# Patient Record
Sex: Female | Born: 1975 | Race: Black or African American | Hispanic: No | Smoking: Never smoker
Health system: Southern US, Community
[De-identification: ages and names within clinical notes are randomized; demographics above are authoritative.]

## PROBLEM LIST (undated history)

## (undated) DIAGNOSIS — D649 Anemia, unspecified: Secondary | ICD-10-CM

## (undated) DIAGNOSIS — E049 Nontoxic goiter, unspecified: Secondary | ICD-10-CM

## (undated) DIAGNOSIS — Z5189 Encounter for other specified aftercare: Secondary | ICD-10-CM

## (undated) DIAGNOSIS — J45909 Unspecified asthma, uncomplicated: Secondary | ICD-10-CM

## (undated) HISTORY — DX: Encounter for other specified aftercare: Z51.89

---

## 2020-08-16 ENCOUNTER — Emergency Department (HOSPITAL_COMMUNITY): Payer: Self-pay

## 2020-08-16 ENCOUNTER — Emergency Department (HOSPITAL_COMMUNITY)
Admission: EM | Admit: 2020-08-16 | Discharge: 2020-08-16 | Disposition: A | Discharge status: Home / Self Care | Payer: Self-pay | Attending: Emergency Medicine | Admitting: Emergency Medicine

## 2020-08-16 ENCOUNTER — Other Ambulatory Visit: Payer: Self-pay

## 2020-08-16 ENCOUNTER — Encounter (HOSPITAL_COMMUNITY): Payer: Self-pay

## 2020-08-16 DIAGNOSIS — J45901 Unspecified asthma with (acute) exacerbation: Secondary | ICD-10-CM | POA: Insufficient documentation

## 2020-08-16 DIAGNOSIS — T59811A Toxic effect of smoke, accidental (unintentional), initial encounter: Secondary | ICD-10-CM

## 2020-08-16 DIAGNOSIS — R519 Headache, unspecified: Secondary | ICD-10-CM | POA: Insufficient documentation

## 2020-08-16 HISTORY — DX: Unspecified asthma, uncomplicated: J45.909

## 2020-08-16 MED ORDER — IPRATROPIUM BROMIDE HFA 17 MCG/ACT IN AERS
2.0000 | INHALATION_SPRAY | Freq: Once | RESPIRATORY_TRACT | Status: AC
  Administered 2020-08-16: 2 via RESPIRATORY_TRACT
  Filled 2020-08-16: qty 12.9

## 2020-08-16 MED ORDER — PREDNISONE 20 MG PO TABS: 40.0000 mg | ORAL_TABLET | Freq: Every day | ORAL | 0 refills | Status: DC

## 2020-08-16 MED ORDER — ALBUTEROL SULFATE HFA 108 (90 BASE) MCG/ACT IN AERS: 8.0000 | INHALATION_SPRAY | RESPIRATORY_TRACT | Status: DC | PRN

## 2020-08-16 MED ORDER — PREDNISONE 20 MG PO TABS
60.0000 mg | ORAL_TABLET | Freq: Once | ORAL | Status: AC
  Administered 2020-08-16: 60 mg via ORAL
  Filled 2020-08-16: qty 3
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
## 2022-02-21 DIAGNOSIS — D259 Leiomyoma of uterus, unspecified: Secondary | ICD-10-CM

## 2022-02-21 HISTORY — DX: Leiomyoma of uterus, unspecified: D25.9

## 2022-02-27 ENCOUNTER — Emergency Department (HOSPITAL_COMMUNITY)
Admission: EM | Admit: 2022-02-27 | Discharge: 2022-02-27 | Disposition: A | Discharge status: Home / Self Care | Payer: Self-pay | Attending: Emergency Medicine | Admitting: Emergency Medicine

## 2022-02-27 ENCOUNTER — Emergency Department (HOSPITAL_COMMUNITY): Payer: Self-pay

## 2022-02-27 ENCOUNTER — Other Ambulatory Visit: Payer: Self-pay

## 2022-02-27 DIAGNOSIS — D259 Leiomyoma of uterus, unspecified: Secondary | ICD-10-CM

## 2022-02-27 DIAGNOSIS — N9489 Other specified conditions associated with female genital organs and menstrual cycle: Secondary | ICD-10-CM | POA: Insufficient documentation

## 2022-02-27 DIAGNOSIS — N939 Abnormal uterine and vaginal bleeding, unspecified: Secondary | ICD-10-CM | POA: Insufficient documentation

## 2022-02-27 DIAGNOSIS — R109 Unspecified abdominal pain: Secondary | ICD-10-CM | POA: Insufficient documentation

## 2022-02-27 DIAGNOSIS — D649 Anemia, unspecified: Secondary | ICD-10-CM | POA: Insufficient documentation

## 2022-02-27 LAB — COMPREHENSIVE METABOLIC PANEL
ALT: 10 U/L (ref 0–44)
AST: 19 U/L (ref 15–41)
Albumin: 3.5 g/dL (ref 3.5–5.0)
Alkaline Phosphatase: 39 U/L (ref 38–126)
Anion gap: 5 (ref 5–15)
BUN: 7 mg/dL (ref 6–20)
CO2: 22 mmol/L (ref 22–32)
Calcium: 8.9 mg/dL (ref 8.9–10.3)
Chloride: 112 mmol/L — ABNORMAL HIGH (ref 98–111)
Creatinine, Ser: 0.69 mg/dL (ref 0.44–1.00)
GFR, Estimated: >60 mL/min (ref >60)
Glucose, Bld: 100 mg/dL — ABNORMAL HIGH (ref 70–99)
Potassium: 3.7 mmol/L (ref 3.5–5.1)
Sodium: 139 mmol/L (ref 135–145)
Total Bilirubin: 0.2 mg/dL — ABNORMAL LOW (ref 0.3–1.2)
Total Protein: 7.1 g/dL (ref 6.5–8.1)

## 2022-02-27 LAB — URINALYSIS, ROUTINE W REFLEX MICROSCOPIC
Bilirubin Urine: NEGATIVE
Glucose, UA: NEGATIVE mg/dL
Hgb urine dipstick: NEGATIVE
Ketones, ur: NEGATIVE mg/dL
Leukocytes,Ua: NEGATIVE
Nitrite: NEGATIVE
Protein, ur: NEGATIVE mg/dL
Specific Gravity, Urine: 1.008 (ref 1.005–1.030)
pH: 6 (ref 5.0–8.0)

## 2022-02-27 LAB — CBC
HCT: 24.8 % — ABNORMAL LOW (ref 36.0–46.0)
Hemoglobin: 6.8 g/dL — CL (ref 12.0–15.0)
MCH: 17.1 pg — ABNORMAL LOW (ref 26.0–34.0)
MCHC: 27.4 g/dL — ABNORMAL LOW (ref 30.0–36.0)
MCV: 62.5 fL — ABNORMAL LOW (ref 80.0–100.0)
Platelets: 318 10*3/uL (ref 150–400)
RBC: 3.97 MIL/uL (ref 3.87–5.11)
RDW: 19.6 % — ABNORMAL HIGH (ref 11.5–15.5)
WBC: 4.1 10*3/uL (ref 4.0–10.5)
nRBC: 0 % (ref 0.0–0.2)

## 2022-02-27 LAB — I-STAT BETA HCG BLOOD, ED (MC, WL, AP ONLY): I-stat hCG, quantitative: <5 m[IU]/mL (ref <5)

## 2022-02-27 LAB — PREPARE RBC (CROSSMATCH)

## 2022-02-27 LAB — ABO/RH: ABO/RH(D): O POS

## 2022-02-27 LAB — LIPASE, BLOOD: Lipase: 35 U/L (ref 11–51)

## 2022-02-27 MED ORDER — SODIUM CHLORIDE 0.9 % IV SOLN: 10.0000 mL/h | Freq: Once | INTRAVENOUS | Status: DC

## 2022-02-27 MED ORDER — HYDROMORPHONE HCL 1 MG/ML IJ SOLN
1.0000 mg | Freq: Once | INTRAMUSCULAR | Status: AC
  Administered 2022-02-27: 1 mg via INTRAVENOUS
  Filled 2022-02-27: qty 1

## 2022-02-27 MED ORDER — MELOXICAM 7.5 MG PO TABS: 7.5000 mg | ORAL_TABLET | Freq: Two times a day (BID) | ORAL | 0 refills | Status: AC | PRN

## 2022-02-27 MED ORDER — IOHEXOL 350 MG/ML SOLN
100.0000 mL | Freq: Once | INTRAVENOUS | Status: AC | PRN
  Administered 2022-02-27: 100 mL via INTRAVENOUS

## 2022-02-27 MED ORDER — MEGESTROL ACETATE 40 MG PO TABS: 120.0000 mg | ORAL_TABLET | Freq: Every day | ORAL | 0 refills | Status: DC

## 2022-02-27 NOTE — ED Notes (Signed)
Pt ambulated to bathroom without assistance 

## 2022-02-27 NOTE — Discharge Instructions (Addendum)
You were given 2 units of blood as a transfusion in the ER for anemia today.  This is likely due to your heavy menstrual bleeding.  Continue taking the iron every day.  I also recommended during your menstrual periods, you take ibuprofen (Advil) 600 mg with breakfast, lunch, and at bedtime.  This can help with both pain and the amount of bleeding that happens.  You should also call to make an appointment with a specialist, OB/GYN provider, who can perform an ultrasound and talk about further medical management for your bleeding.  I have prescribed Megesterol (Megace), to be taken '120mg'$  / day - for the next 30 days.  You will need to call the phone number above for the women's clinic to set up follow up and be seen in the next 2-3 weeks.

## 2022-02-27 NOTE — ED Provider Notes (Signed)
I have examined this patient in person, she does have a mildly tender mass in the lower abdomen that is consistent with having the fibroid uterus on CT scan.  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  Symptoms are minimal, I did discuss the care is with Dr. Elonda Husky -agrees with the management of transfusion and outpatient follow-up and request Megace 120 mg daily for 30 days.  Patient agreeable to the plan, stable for discharge   Noemi Chapel, MD 02/27/22 1911

## 2022-02-27 NOTE — ED Triage Notes (Signed)
Pt. Stated, Im in pain in my lower stomach for a week. And just feel weak and tired.

## 2022-02-27 NOTE — ED Notes (Signed)
Reviewed discharge instruction with pt. Education pt on medication and scheduling follow up appointments. Pt verbalized agreement.

## 2022-02-27 NOTE — ED Notes (Signed)
Hgb 6.8 reported as critical from the lab. Dr. Langston Masker notified. Patient presented to the ED for same at office

## 2022-02-27 NOTE — ED Provider Notes (Signed)
[place] EMERGENCY DEPARTMENT Provider Note   CSN: [id number] Arrival date & time: 02/27/22  4128     History  Chief Complaint  Patient presents with   Abdominal Pain   Weakness    Kirsten Brown is a 46 y.o. female with history of heavy menstrual bleeding presenting to the ED with complaint of abdominal pain.  She reports this began about a week ago, primarily left lower side abdominal pain, bloating, significantly worsening today.  She denies diarrhea or constipation problems.  She denies bloody bowel movements or black or tarry stool.  She is not actively on her menstrual cycle.  She does report that her menses are "extremely heavy".  She reports she takes iron for this, no other medications.  She does not have an OB/GYN.  She does report that she has felt weak, lightheaded, tired this week.  HPI     Home Medications Prior to Admission medications   Medication Sig Start Date End Date Taking? Authorizing Provider  predniSONE (DELTASONE) 20 MG tablet Take 2 tablets (40 mg total) by mouth daily. 08/16/20   Carlisle Cater, PA-C      Allergies    Asa [aspirin]    Review of Systems   Review of Systems  Physical Exam Updated Vital Signs BP 116/69   Pulse 66   Temp 98.1 F (36.7 C)   Resp 17   Ht '5\' 7"'$  (1.702 m)   Wt 68.5 kg   LMP 02/04/2022   SpO2 100%   BMI 23.65 kg/m  Physical Exam Constitutional:      General: She is not in acute distress. HENT:     Head: Normocephalic and atraumatic.  Eyes:     Conjunctiva/sclera: Conjunctivae normal.     Pupils: Pupils are equal, round, and reactive to light.  Cardiovascular:     Rate and Rhythm: Normal rate and regular rhythm.  Pulmonary:     Effort: Pulmonary effort is normal. No respiratory distress.  Abdominal:     General: There is no distension.     Tenderness: There is abdominal tenderness in the suprapubic area and left lower quadrant.  Skin:    General: Skin is warm and dry.   Neurological:     General: No focal deficit present.     Mental Status: She is alert. Mental status is at baseline.  Psychiatric:        Mood and Affect: Mood normal.        Behavior: Behavior normal.    ED Results / Procedures / Treatments   Labs (all labs ordered are listed, but only abnormal results are displayed) Labs Reviewed  COMPREHENSIVE METABOLIC PANEL - Abnormal; Notable for the following components:      Result Value   Chloride 112 (*)    Glucose, Bld 100 (*)    Total Bilirubin 0.2 (*)    All other components within normal limits  CBC - Abnormal; Notable for the following components:   Hemoglobin 6.8 (*)    HCT 24.8 (*)    MCV 62.5 (*)    MCH 17.1 (*)    MCHC 27.4 (*)    RDW 19.6 (*)    All other components within normal limits  URINALYSIS, ROUTINE W REFLEX MICROSCOPIC - Abnormal; Notable for the following components:   Color, Urine STRAW (*)    APPearance HAZY (*)    All other components within normal limits  LIPASE, BLOOD  I-STAT BETA HCG BLOOD, ED ([facility], [facility], [facility] ONLY)  TYPE AND SCREEN  ABO/RH  PREPARE RBC (CROSSMATCH)    EKG None  Radiology No results found.  Procedures .Critical Care Performed by: Wyvonnia Dusky, MD Authorized by: Wyvonnia Dusky, MD   Critical care provider statement:    Critical care time (minutes):  45   Critical care time was exclusive of:  Separately billable procedures and treating other patients   Critical care was necessary to treat or prevent imminent or life-threatening deterioration of the following conditions:  Circulatory failure   Critical care was time spent personally by me on the following activities:  Ordering and performing treatments and interventions, ordering and review of laboratory studies, ordering and review of radiographic studies, pulse oximetry, review of old charts, examination of patient and evaluation of patient's response to treatment   Care discussed with: admitting provider   Comments:      Symptomatic anemia, transfusion    Medications Ordered in ED Medications  0.9 %  sodium chloride infusion (has no administration in time range)  HYDROmorphone (DILAUDID) injection 1 mg (1 mg Intravenous Given 02/27/22 1148)    ED Course/ Medical Decision Making/ A&P Clinical Course as of 02/27/22 1619  Wed Feb 27, 2022  1337 Pain is improved with IV Dilaudid, patient more comfortable now.  She is reportedly next in line for CT scan. [MT]  77 RN has contacted CT multiple times - told patient will be next.  Vitals remain stable at this time. [MT]  1549 Patient reassessed, currently receiving blood transfusion, reports pain is significantly improved and agree clinically this the case.  Plan is to complete 2 units of red blood cell transfusion, and obtain CT scan.  If there are uterine fibroids or nonemergent cause of her abdominal pain, she could be discharged with OB/GYN follow-up.  She is already taking iron, can start taking ibuprofen regularly around her menstrual cycles, which unfortunately are irregular and sometimes bimonthly. [MT]  1550 She is hungry and requesting food. [MT]    Clinical Course User Index [MT] Nicol Herbig, Carola Rhine, MD                           Medical Decision Making Amount and/or Complexity of Data Reviewed Labs: ordered. Radiology: ordered.  Risk Prescription drug management.   This patient presents to the Emergency Department with complaint of abdominal pain. This involves an extensive number of treatment options, and is a complaint that carries with it a high risk of complications and morbidity.  The differential diagnosis includes, but is not limited to, gastritis vs biliary disease vs peptic ulcer vs constipation vs colitis vs UTI vs other  I ordered, reviewed, and interpreted labs, notable for anemia hgb 6.8, microcytic, consistent with iron deficiency/blood loss anemia - no prior records for comparison.  Given that the patient is symptomatic at this time  from an anemia perspective, I have ordered 2 units of blood to be transfused, the patient was consented verbally for blood transfusion.  I ordered medication IV Dilaudid for abdominal pain, 2 units of red blood cells for symptomatic anemia I ordered imaging studies which included CT abdomen pelvis with contrast CT imaging pending at the time of signout to Dr Sabra Heck EDP at 430 pm        Final Clinical Impression(s) / ED Diagnoses Final diagnoses:  Abnormal uterine bleeding  Symptomatic anemia    Rx / DC Orders ED Discharge Orders     None  Wyvonnia Dusky, MD 02/27/22 1620

## 2022-02-27 NOTE — ED Notes (Signed)
RN verified blood with second RN. Blood had O Negative on the bag, on the green sheet pt blood type read as O positive. RN walked Blood and green sheet down to blood bank to verify pt is receiving correct unit of blood. Blood Bank verified that pt can receive O negative blood.

## 2022-02-28 LAB — TYPE AND SCREEN
ABO/RH(D): O POS
Antibody Screen: NEGATIVE
Unit division: 0
Unit division: 0

## 2022-02-28 LAB — BPAM RBC
Blood Product Expiration Date: 202306092359
Blood Product Expiration Date: 202306142359
ISSUE DATE / TIME: 202306071440
ISSUE DATE / TIME: 202306071747
Unit Type and Rh: 5100
Unit Type and Rh: 9500

## 2022-03-21 ENCOUNTER — Encounter: Payer: Self-pay | Admitting: Obstetrics and Gynecology

## 2022-03-21 ENCOUNTER — Other Ambulatory Visit (HOSPITAL_COMMUNITY)
Admission: RE | Admit: 2022-03-21 | Discharge: 2022-03-21 | Disposition: A | Discharge status: Home / Self Care | Payer: Self-pay | Source: Ambulatory Visit | Attending: Obstetrics and Gynecology | Admitting: Obstetrics and Gynecology

## 2022-03-21 ENCOUNTER — Ambulatory Visit (INDEPENDENT_AMBULATORY_CARE_PROVIDER_SITE_OTHER): Payer: Self-pay | Admitting: Obstetrics and Gynecology

## 2022-03-21 VITALS — BP 116/72 | Ht 67.0 in | Wt 152.8 lb

## 2022-03-21 DIAGNOSIS — D219 Benign neoplasm of connective and other soft tissue, unspecified: Secondary | ICD-10-CM

## 2022-03-21 DIAGNOSIS — N939 Abnormal uterine and vaginal bleeding, unspecified: Secondary | ICD-10-CM

## 2022-03-21 DIAGNOSIS — Z7689 Persons encountering health services in other specified circumstances: Secondary | ICD-10-CM

## 2022-03-21 HISTORY — PX: ENDOMETRIAL BIOPSY: SHX622

## 2022-03-21 NOTE — Progress Notes (Signed)
Patient presents today for ED follow-up due to abnormal bleeding and uterine fibroids. She states she has experienced abnormal bleeding since 20218, multiple periods a month and spotting between cycles. She reports moderate cramping as well, right sided more often. Patient states she recently finished Flagyl for BV. No other questions or concerns at this time.

## 2022-03-21 NOTE — Progress Notes (Signed)
HPI:      [name] is a 46 y.o. [phone] who LMP was Patient's last menstrual period was 03/16/2022 (exact date).  Subjective:   She presents today to discuss large uterine fibroids.  She has had significant bleeding and cramping over the last several months which culminated in significant chronic blood loss anemia with a hemoglobin of 6.  In addition she has pain almost every day even when she is not bleeding.  She cannot sit comfortably in a chair.  She is ready to have definitive management for her large fibroids.  She reports that she is absolutely completed childbearing.    Hx: The following portions of the patient's history were reviewed and updated as appropriate:             She  has a past medical history of Asthma and Blood transfusion without reported diagnosis. She does not have a problem list on file. She  has no past surgical history on file. Her family history includes Diabetes in her father; Hypertension in her father and mother. She  reports that she has never smoked. She has never used smokeless tobacco. She reports that she does not currently use alcohol. She reports that she does not use drugs. She has a current medication list which includes the following prescription(s): black currant seed oil, ferrous sulfate, iron-folic acid-vit E70, and turmeric. She is allergic to asa [aspirin].       Review of Systems:  Review of Systems  Constitutional: Denied constitutional symptoms, night sweats, recent illness, fatigue, fever, insomnia and weight loss.  Eyes: Denied eye symptoms, eye pain, photophobia, vision change and visual disturbance.  Ears/Nose/Throat/Neck: Denied ear, nose, throat or neck symptoms, hearing loss, nasal discharge, sinus congestion and sore throat.  Cardiovascular: Denied cardiovascular symptoms, arrhythmia, chest pain/pressure, edema, exercise intolerance, orthopnea and palpitations.  Respiratory: Denied pulmonary symptoms, asthma, pleuritic pain,  productive sputum, cough, dyspnea and wheezing.  Gastrointestinal: Denied, gastro-esophageal reflux, melena, nausea and vomiting.  Genitourinary: See HPI for additional information.  Musculoskeletal: Denied musculoskeletal symptoms, stiffness, swelling, muscle weakness and myalgia.  Dermatologic: Denied dermatology symptoms, rash and scar.  Neurologic: Denied neurology symptoms, dizziness, headache, neck pain and syncope.  Psychiatric: Denied psychiatric symptoms, anxiety and depression.  Endocrine: Denied endocrine symptoms including hot flashes and night sweats.   Meds:   Current Outpatient Medications on File Prior to Visit  Medication Sig Dispense Refill   BLACK CURRANT SEED OIL PO Take by mouth.     ferrous sulfate 325 (65 FE) MG tablet Take 325 mg by mouth daily with breakfast.     IRON-FOLIC ACID-VIT J50 PO Take by mouth.     Turmeric (QC TUMERIC COMPLEX PO) Take by mouth.     No current facility-administered medications on file prior to visit.      Objective:     Vitals:   03/21/22 1434  BP: 116/72   Filed Weights   03/21/22 1434  Weight: 152 lb 12.8 oz (69.3 kg)              Physical examination   Pelvic:   Vulva: Normal appearance.  No lesions.  Vagina: No lesions or abnormalities noted.  Support: Normal pelvic support.  Urethra No masses tenderness or scarring.  Meatus Normal size without lesions or prolapse.  Cervix: Normal appearance.  No lesions.  Anus: Normal exam.  No lesions.  Perineum: Normal exam.  No lesions.        Bimanual   Uterus: 20 weeks  size, irregular, tender.  Mobile.  AV.  Adnexae: No masses.  Non-tender to palpation.  Cul-de-sac: Negative for abnormality.   Endometrial Biopsy After discussion with the patient regarding her abnormal uterine bleeding I recommended that she proceed with an endometrial biopsy for further diagnosis. The risks, benefits, alternatives, and indications for an endometrial biopsy were discussed with the patient  in detail. She understood the risks including infection, bleeding, cervical laceration and uterine perforation.  Verbal consent was obtained.   PROCEDURE NOTE:  Vacurette endometrial biopsy was performed using aseptic technique with iodine preparation.  The uterus was sounded to a length of as long as the pipette.  Adequate sampling was obtained with minimal blood loss.  The patient tolerated the procedure well.  Disposition will be pending pathology           Assessment:    [id number] There are no problems to display for this patient.    1. Establishing care with new doctor, encounter for   2. Fibroid   3. Abnormal uterine bleeding     Await biopsy results if negative for hyperplasia or malignancy consider other managements.   Plan:            1.  Multiple managements for uterine fibroids discussed in detail.  The natural course and history of fibroids and menopause was discussed.  Ways of managing bleeding and cramping with fibroids including IUD, uterine fibroid embolization, endometrial ablation and hysterectomy were discussed.  Patient seems to be leaning toward hysterectomy because she desires immediate definitive management.  Will likely need TAH or possibly robot for hysterectomy. Plan to discuss with Cherry  Orders No orders of the defined types were placed in this encounter.   No orders of the defined types were placed in this encounter.     F/U  Return in about 3 weeks (around 04/11/2022) for We will contact her with any abnormal test results. I spent 34 minutes involved in the care of this patient preparing to see the patient by obtaining and reviewing her medical history (including labs, imaging tests and prior procedures), documenting clinical information in the electronic health record (EHR), counseling and coordinating care plans, writing and sending prescriptions, ordering tests or procedures and in direct communicating with the patient and medical staff discussing  pertinent items from her history and physical exam.  Finis Bud, M.D. 03/21/2022 3:33 PM

## 2022-03-25 LAB — SURGICAL PATHOLOGY

## 2022-04-03 NOTE — Progress Notes (Signed)
Kirsten Brown: Good news, your results show not problems with the lining of the uterus.  Therefor we now know that the fibroids are the issue. Dr. Amalia Hailey

## 2022-04-04 NOTE — Progress Notes (Signed)
Result forwarded via MyChart

## 2022-05-24 ENCOUNTER — Encounter: Payer: Self-pay | Admitting: Obstetrics and Gynecology

## 2022-05-24 ENCOUNTER — Ambulatory Visit (INDEPENDENT_AMBULATORY_CARE_PROVIDER_SITE_OTHER): Payer: Self-pay | Admitting: Obstetrics and Gynecology

## 2022-05-24 VITALS — BP 108/70 | Ht 67.0 in | Wt 157.0 lb

## 2022-05-24 DIAGNOSIS — D219 Benign neoplasm of connective and other soft tissue, unspecified: Secondary | ICD-10-CM

## 2022-05-24 NOTE — Progress Notes (Signed)
Patient presents today to follow-up on fibroids. She states she has made the decision to have a hysterectomy at this time. No additional concerns.

## 2022-05-24 NOTE — Progress Notes (Signed)
HPI:      [name] is a 46 y.o. [phone] who LMP was Patient's last menstrual period was 05/07/2022 (exact date).  Subjective:   She presents today to discuss her endometrial biopsy and large uterine fibroids.  She reports that over the last month she had a period but it was not as severe as it was in the past.  She still remains generally uncomfortable from large uterine fibroids.  She has decided upon definitive management-TAH as previously discussed.    Hx: The following portions of the patient's history were reviewed and updated as appropriate:             She  has a past medical history of Asthma and Blood transfusion without reported diagnosis. She does not have a problem list on file. She  has no past surgical history on file. Her family history includes Diabetes in her father; Hypertension in her father and mother. She  reports that she has never smoked. She has never used smokeless tobacco. She reports that she does not currently use alcohol. She reports that she does not use drugs. She currently has no medications in their medication list. She is allergic to asa [aspirin].       Review of Systems:  Review of Systems  Constitutional: Denied constitutional symptoms, night sweats, recent illness, fatigue, fever, insomnia and weight loss.  Eyes: Denied eye symptoms, eye pain, photophobia, vision change and visual disturbance.  Ears/Nose/Throat/Neck: Denied ear, nose, throat or neck symptoms, hearing loss, nasal discharge, sinus congestion and sore throat.  Cardiovascular: Denied cardiovascular symptoms, arrhythmia, chest pain/pressure, edema, exercise intolerance, orthopnea and palpitations.  Respiratory: Denied pulmonary symptoms, asthma, pleuritic pain, productive sputum, cough, dyspnea and wheezing.  Gastrointestinal: Denied, gastro-esophageal reflux, melena, nausea and vomiting.  Genitourinary: See HPI for additional information.  Musculoskeletal: Denied musculoskeletal  symptoms, stiffness, swelling, muscle weakness and myalgia.  Dermatologic: Denied dermatology symptoms, rash and scar.  Neurologic: Denied neurology symptoms, dizziness, headache, neck pain and syncope.  Psychiatric: Denied psychiatric symptoms, anxiety and depression.  Endocrine: Denied endocrine symptoms including hot flashes and night sweats.   Meds:   No current outpatient medications on file prior to visit.   No current facility-administered medications on file prior to visit.      Objective:     Vitals:   05/24/22 1126  BP: 108/70   Filed Weights   05/24/22 1126  Weight: 157 lb (71.2 kg)                        Assessment:    [id number] There are no problems to display for this patient.    1. Fibroid     Large uterine fibroids greater than 20 weeks causing pelvic pain and severe dysmenorrhea. Patient desires hysterectomy. No hyperplasia or malignancy on endometrial biopsy   Plan:            1.  Patient to schedule for preop appointment.  2.  We have discussed possibility of oophorectomy at the time of TAH.  Patient will consider this option and inform us at her preop appointment.  Endometrial biopsy results reviewed with the patient. Orders No orders of the defined types were placed in this encounter.   No orders of the defined types were placed in this encounter.     F/U  Return in about 3 weeks (around 06/14/2022). I spent 18 minutes involved in the care of this patient preparing to see the patient by obtaining  and reviewing her medical history (including labs, imaging tests and prior procedures), documenting clinical information in the electronic health record (EHR), counseling and coordinating care plans, writing and sending prescriptions, ordering tests or procedures and in direct communicating with the patient and medical staff discussing pertinent items from her history and physical exam.  Finis Bud, M.D. 05/24/2022 12:03 PM

## 2022-05-28 ENCOUNTER — Encounter: Payer: Self-pay | Admitting: Internal Medicine

## 2022-05-28 ENCOUNTER — Ambulatory Visit: Payer: Self-pay | Admitting: Internal Medicine

## 2022-05-28 VITALS — BP 110/70 | HR 80 | Resp 12 | Ht 65.0 in | Wt 154.0 lb

## 2022-05-28 DIAGNOSIS — T7431XA Adult psychological abuse, confirmed, initial encounter: Secondary | ICD-10-CM | POA: Insufficient documentation

## 2022-05-28 DIAGNOSIS — D500 Iron deficiency anemia secondary to blood loss (chronic): Secondary | ICD-10-CM | POA: Insufficient documentation

## 2022-05-28 DIAGNOSIS — E049 Nontoxic goiter, unspecified: Secondary | ICD-10-CM

## 2022-05-28 DIAGNOSIS — D259 Leiomyoma of uterus, unspecified: Secondary | ICD-10-CM

## 2022-05-28 DIAGNOSIS — J4540 Moderate persistent asthma, uncomplicated: Secondary | ICD-10-CM

## 2022-05-28 MED ORDER — BUDESONIDE-FORMOTEROL FUMARATE 80-4.5 MCG/ACT IN AERO: 2.0000 | INHALATION_SPRAY | Freq: Two times a day (BID) | RESPIRATORY_TRACT | 11 refills | Status: DC
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
## 2022-05-28 NOTE — Patient Instructions (Signed)
Call for influenza and COVID vaccines end of September

## 2022-05-30 ENCOUNTER — Other Ambulatory Visit: Payer: Self-pay

## 2022-05-30 DIAGNOSIS — Z13220 Encounter for screening for lipoid disorders: Secondary | ICD-10-CM

## 2022-05-30 DIAGNOSIS — D500 Iron deficiency anemia secondary to blood loss (chronic): Secondary | ICD-10-CM

## 2022-05-30 DIAGNOSIS — Z79899 Other long term (current) drug therapy: Secondary | ICD-10-CM

## 2022-05-31 LAB — COMPREHENSIVE METABOLIC PANEL
ALT: 13 IU/L (ref 0–32)
AST: 20 IU/L (ref 0–40)
Albumin/Globulin Ratio: 1.5 (ref 1.2–2.2)
Albumin: 4.2 g/dL (ref 3.9–4.9)
Alkaline Phosphatase: 46 IU/L (ref 44–121)
BUN/Creatinine Ratio: 13 (ref 9–23)
BUN: 9 mg/dL (ref 6–24)
Bilirubin Total: 0.3 mg/dL (ref 0.0–1.2)
CO2: 22 mmol/L (ref 20–29)
Calcium: 9.5 mg/dL (ref 8.7–10.2)
Chloride: 104 mmol/L (ref 96–106)
Creatinine, Ser: 0.72 mg/dL (ref 0.57–1.00)
Globulin, Total: 2.8 g/dL (ref 1.5–4.5)
Glucose: 91 mg/dL (ref 70–99)
Potassium: 4.3 mmol/L (ref 3.5–5.2)
Sodium: 140 mmol/L (ref 134–144)
Total Protein: 7 g/dL (ref 6.0–8.5)
eGFR: 105 mL/min/{1.73_m2} (ref >59)

## 2022-05-31 LAB — CBC WITH DIFFERENTIAL/PLATELET
Basophils Absolute: 0 10*3/uL (ref 0.0–0.2)
Basos: 1 %
EOS (ABSOLUTE): 0.5 10*3/uL — ABNORMAL HIGH (ref 0.0–0.4)
Eos: 9 %
Hematocrit: 40.8 % (ref 34.0–46.6)
Hemoglobin: 13.6 g/dL (ref 11.1–15.9)
Immature Grans (Abs): 0 10*3/uL (ref 0.0–0.1)
Immature Granulocytes: 0 %
Lymphocytes Absolute: 1.7 10*3/uL (ref 0.7–3.1)
Lymphs: 35 %
MCH: 29.2 pg (ref 26.6–33.0)
MCHC: 33.3 g/dL (ref 31.5–35.7)
MCV: 88 fL (ref 79–97)
Monocytes Absolute: 0.4 10*3/uL (ref 0.1–0.9)
Monocytes: 8 %
Neutrophils Absolute: 2.2 10*3/uL (ref 1.4–7.0)
Neutrophils: 47 %
Platelets: 197 10*3/uL (ref 150–450)
RBC: 4.66 x10E6/uL (ref 3.77–5.28)
RDW: 14.8 % (ref 11.7–15.4)
WBC: 4.8 10*3/uL (ref 3.4–10.8)

## 2022-05-31 LAB — LIPID PANEL W/O CHOL/HDL RATIO
Cholesterol, Total: 171 mg/dL (ref 100–199)
HDL: 50 mg/dL (ref >39)
LDL Chol Calc (NIH): 110 mg/dL — ABNORMAL HIGH (ref 0–99)
Triglycerides: 58 mg/dL (ref 0–149)
VLDL Cholesterol Cal: 11 mg/dL (ref 5–40)

## 2022-06-13 ENCOUNTER — Encounter: Payer: Self-pay | Admitting: Obstetrics and Gynecology

## 2022-06-13 DIAGNOSIS — Z01818 Encounter for other preprocedural examination: Secondary | ICD-10-CM

## 2022-06-14 ENCOUNTER — Encounter: Payer: Self-pay | Admitting: Obstetrics and Gynecology

## 2022-06-14 ENCOUNTER — Ambulatory Visit (INDEPENDENT_AMBULATORY_CARE_PROVIDER_SITE_OTHER): Payer: Self-pay | Admitting: Obstetrics and Gynecology

## 2022-06-14 VITALS — BP 124/86 | Ht 65.0 in | Wt 153.0 lb

## 2022-06-14 DIAGNOSIS — N939 Abnormal uterine and vaginal bleeding, unspecified: Secondary | ICD-10-CM

## 2022-06-14 DIAGNOSIS — D219 Benign neoplasm of connective and other soft tissue, unspecified: Secondary | ICD-10-CM

## 2022-06-14 DIAGNOSIS — Z01818 Encounter for other preprocedural examination: Secondary | ICD-10-CM
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
## 2022-06-14 NOTE — H&P (View-Only) (Signed)
PRE-OPERATIVE HISTORY AND PHYSICAL EXAM   PCP:  Mack Hook, MD Subjective:   HPI:  Kirsten Brown is a 46 y.o. [phone].  Patient's last menstrual period was 05/14/2022 (exact date).  She presents today for a pre-op discussion and PE.  She has the following symptoms: 22-week uterine fibroids.  Abdominal/pelvic pain.  Dysfunctional uterine bleeding including menorrhagia that drops her hemoglobin.  Review of Systems:   Constitutional: Denied constitutional symptoms, night sweats, recent illness, fatigue, fever, insomnia and weight loss.  Eyes: Denied eye symptoms, eye pain, photophobia, vision change and visual disturbance.  Ears/Nose/Throat/Neck: Denied ear, nose, throat or neck symptoms, hearing loss, nasal discharge, sinus congestion and sore throat.  Cardiovascular: Denied cardiovascular symptoms, arrhythmia, chest pain/pressure, edema, exercise intolerance, orthopnea and palpitations.  Respiratory: Denied pulmonary symptoms, asthma, pleuritic pain, productive sputum, cough, dyspnea and wheezing.  Gastrointestinal: Denied, gastro-esophageal reflux, melena, nausea and vomiting.  Genitourinary: See HPI for additional information.  Musculoskeletal: Denied musculoskeletal symptoms, stiffness, swelling, muscle weakness and myalgia.  Dermatologic: Denied dermatology symptoms, rash and scar.  Neurologic: Denied neurology symptoms, dizziness, headache, neck pain and syncope.  Psychiatric: Denied psychiatric symptoms, anxiety and depression.  Endocrine: Denied endocrine symptoms including hot flashes and night sweats.   OB History  Gravida Para Term Preterm AB Living  '4 3 3   1 3  '$ SAB IAB Ectopic Multiple Live Births    1     3    # Outcome Date GA Lbr Len/2nd Weight Sex Delivery Anes PTL Lv  4 Term 2013     Vag-Spont   LIV  3 Term 2007     Vag-Spont   LIV  2 Term 1999     Vag-Spont   LIV  1 IAB             Past Medical History:  Diagnosis Date   Asthma    Blood  transfusion without reported diagnosis    Fibroid uterus 02/2022    Past Surgical History:  Procedure Laterality Date   ENDOMETRIAL BIOPSY  03/21/2022   Benign      SOCIAL HISTORY:  Social History   Tobacco Use  Smoking Status Never   Passive exposure: Never  Smokeless Tobacco Never   Social History   Substance and Sexual Activity  Alcohol Use Not Currently   Comment: occasionally    Social History   Substance and Sexual Activity  Drug Use Never    Family History  Problem Relation Age of Onset   Heart disease Mother        pacemaker   Hypertension Mother    Hypertension Father    Diabetes Father    Other Sister        Recurrent skin lesion on chest   Cancer Brother        Unknown cancer in his leg for which he underwent successful surgery   Allergies Son        sinus issues    ALLERGIES:  Asa [aspirin]  MEDS:   Current Outpatient Medications on File Prior to Visit  Medication Sig Dispense Refill   albuterol (VENTOLIN HFA) 108 (90 Base) MCG/ACT inhaler Inhale 2 puffs into the lungs every 6 (six) hours as needed for wheezing or shortness of breath.     budesonide-formoterol (SYMBICORT) 80-4.5 MCG/ACT inhaler Inhale 2 puffs into the lungs 2 (two) times daily. 1 each 11   Cholecalciferol (VITAMIN D) 50 MCG (2000 UT) CAPS Take 1,000 Units by mouth daily.  2 chews by mouth once daily.     ferrous sulfate 324 MG TBEC Take 324 mg by mouth. 1 tab by mouth daily     No current facility-administered medications on file prior to visit.    No orders of the defined types were placed in this encounter.    Physical examination BP 124/86   Ht '5\' 5"'$  (1.651 m)   Wt 153 lb (69.4 kg)   LMP 05/14/2022 (Exact Date)   BMI 25.46 kg/m   General NAD, Conversant  HEENT Atraumatic; Op clear with mmm.  Normo-cephalic. Pupils reactive. Anicteric sclerae  Thyroid/Neck Smooth without nodularity or enlargement. Normal ROM.  Neck Supple.  Skin No rashes, lesions or ulceration.  Normal palpated skin turgor. No nodularity.  Breasts: No masses or discharge.  Symmetric.  No axillary adenopathy.  Lungs: Clear to auscultation.No rales or wheezes. Normal Respiratory effort, no retractions.  Heart: NSR.  No murmurs or rubs appreciated. No periferal edema  Abdomen: Large midline abdominal mass u+2 consistent with fibroids  Extremities: Moves all appropriately.  Normal ROM for age. No lymphadenopathy.  Neuro: Oriented to PPT.  Normal mood. Normal affect.     Pelvic:   Vulva: Normal appearance.  No lesions.  Vagina: No lesions or abnormalities noted.  Support: Normal pelvic support.  Urethra No masses tenderness or scarring.  Meatus Normal size without lesions or prolapse.  Cervix: Normal ectropion.  No lesions.  Anus: Normal exam.  No lesions.  Perineum: Normal exam.  No lesions.        Bimanual   Uterus: 22-week plus size  Adnexae: No masses.  Non-tender to palpation.  Cul-de-sac: Negative for abnormality.   Assessment:   [id number] Patient Active Problem List   Diagnosis Date Noted   Moderate persistent asthma without complication 44/81/8563   Iron deficiency anemia due to chronic blood loss 05/28/2022   Abuse, adult emotional 05/28/2022   Nodular goiter 05/28/2022   Fibroid uterus 02/2022    1. Fibroid   2. Abnormal uterine bleeding      Plan:   Orders: No orders of the defined types were placed in this encounter.    1.  TAH bilateral salpingectomy  Pre-op discussions regarding Risks and Benefits of her scheduled surgery.  TAH The procedure of Total Abdominal Hysterectomy was described to the patient in detail.  We reviewed the rationale for Hysterectomy and the patient was again informed of other non-surgical management possibilities for her condition.  She has considered these other options, and desires a Hysterectomy.  We have reviewed the fact that Hysterectomy is permanent and that following the procedure she will not be able to become pregnant  or bear children.  We have discussed the following risk factors specifically, and the patient has also been informed that additional complications not mentioned may develop;  damage to bowel, bladder, ureters, or to other internal organs, bleeding, infection and the risk from anesthesia.  We have discussed the procedure itself in detail and she has an informed understanding of this surgery.  We have also discussed the recovery period in which physical and sexual activity will be restricted for a varying degree of time, often 3 - 6 weeks.  I have answered all of her questions and I believe she has an informed understanding of Abdominal Hysterectomy. Oophorectomy The option of Oophorectomy has been discussed with the patient.  Detailed risk/benefits have been reviewed.  The risks discussed include, but are not limited to, hemorrhage, infection, damage to ureter or other internal  organ, and Ovarian Remnant Syndrome.  The benefits include a significant decrease in the risk of Ovarian Cancer and in benign Ovarian disease.  The risk of Ovarian CA has been estimated at 1 in 74.  This is a relatively small risk.  However, should Ovarian CA develop, it is often found late in the course of the disease.  We have also discussed the role of inheritance in the development of Ovarian disease.  Some women, who have close relatives with Ovarian CA, have a higher than 1 in 70 risk of Ovarian CA.  The benefits of Estrogen replacement therapy following Oophorectomy has been stressed.  If she is premenopausal, we have discussed the fact that this procedure will make her permanently sterile and that premature menopause will result if no ERT is begun.  I have answered all of her questions, and I believe that she has an adequate and informed understanding of the risks and benefits of Oophorectomy. She has specifically requested to keep her ovaries.  I spent 33 minutes involved in the care of this patient preparing to see the patient  by obtaining and reviewing her medical history (including labs, imaging tests and prior procedures), documenting clinical information in the electronic health record (EHR), counseling and coordinating care plans, writing and sending prescriptions, ordering tests or procedures and in direct communicating with the patient and medical staff discussing pertinent items from her history and physical exam.  Finis Bud, M.D. 06/14/2022 12:25 PM

## 2022-06-14 NOTE — H&P (Signed)
PRE-OPERATIVE HISTORY AND PHYSICAL EXAM   PCP:  Mack Hook, MD Subjective:   HPI:  Kirsten Brown is a 46 y.o. [phone].  Patient's last menstrual period was 05/14/2022 (exact date).  She presents today for a pre-op discussion and PE.  She has the following symptoms: 22-week uterine fibroids.  Abdominal/pelvic pain.  Dysfunctional uterine bleeding including menorrhagia that drops her hemoglobin.  Review of Systems:   Constitutional: Denied constitutional symptoms, night sweats, recent illness, fatigue, fever, insomnia and weight loss.  Eyes: Denied eye symptoms, eye pain, photophobia, vision change and visual disturbance.  Ears/Nose/Throat/Neck: Denied ear, nose, throat or neck symptoms, hearing loss, nasal discharge, sinus congestion and sore throat.  Cardiovascular: Denied cardiovascular symptoms, arrhythmia, chest pain/pressure, edema, exercise intolerance, orthopnea and palpitations.  Respiratory: Denied pulmonary symptoms, asthma, pleuritic pain, productive sputum, cough, dyspnea and wheezing.  Gastrointestinal: Denied, gastro-esophageal reflux, melena, nausea and vomiting.  Genitourinary: See HPI for additional information.  Musculoskeletal: Denied musculoskeletal symptoms, stiffness, swelling, muscle weakness and myalgia.  Dermatologic: Denied dermatology symptoms, rash and scar.  Neurologic: Denied neurology symptoms, dizziness, headache, neck pain and syncope.  Psychiatric: Denied psychiatric symptoms, anxiety and depression.  Endocrine: Denied endocrine symptoms including hot flashes and night sweats.   OB History  Gravida Para Term Preterm AB Living  '4 3 3   1 3  '$ SAB IAB Ectopic Multiple Live Births    1     3    # Outcome Date GA Lbr Len/2nd Weight Sex Delivery Anes PTL Lv  4 Term 2013     Vag-Spont   LIV  3 Term 2007     Vag-Spont   LIV  2 Term 1999     Vag-Spont   LIV  1 IAB             Past Medical History:  Diagnosis Date   Asthma    Blood  transfusion without reported diagnosis    Fibroid uterus 02/2022    Past Surgical History:  Procedure Laterality Date   ENDOMETRIAL BIOPSY  03/21/2022   Benign      SOCIAL HISTORY:  Social History   Tobacco Use  Smoking Status Never   Passive exposure: Never  Smokeless Tobacco Never   Social History   Substance and Sexual Activity  Alcohol Use Not Currently   Comment: occasionally    Social History   Substance and Sexual Activity  Drug Use Never    Family History  Problem Relation Age of Onset   Heart disease Mother        pacemaker   Hypertension Mother    Hypertension Father    Diabetes Father    Other Sister        Recurrent skin lesion on chest   Cancer Brother        Unknown cancer in his leg for which he underwent successful surgery   Allergies Son        sinus issues    ALLERGIES:  Asa [aspirin]  MEDS:   Current Outpatient Medications on File Prior to Visit  Medication Sig Dispense Refill   albuterol (VENTOLIN HFA) 108 (90 Base) MCG/ACT inhaler Inhale 2 puffs into the lungs every 6 (six) hours as needed for wheezing or shortness of breath.     budesonide-formoterol (SYMBICORT) 80-4.5 MCG/ACT inhaler Inhale 2 puffs into the lungs 2 (two) times daily. 1 each 11   Cholecalciferol (VITAMIN D) 50 MCG (2000 UT) CAPS Take 1,000 Units by mouth daily.  2 chews by mouth once daily.     ferrous sulfate 324 MG TBEC Take 324 mg by mouth. 1 tab by mouth daily     No current facility-administered medications on file prior to visit.    No orders of the defined types were placed in this encounter.    Physical examination BP 124/86   Ht '5\' 5"'$  (1.651 m)   Wt 153 lb (69.4 kg)   LMP 05/14/2022 (Exact Date)   BMI 25.46 kg/m   General NAD, Conversant  HEENT Atraumatic; Op clear with mmm.  Normo-cephalic. Pupils reactive. Anicteric sclerae  Thyroid/Neck Smooth without nodularity or enlargement. Normal ROM.  Neck Supple.  Skin No rashes, lesions or ulceration.  Normal palpated skin turgor. No nodularity.  Breasts: No masses or discharge.  Symmetric.  No axillary adenopathy.  Lungs: Clear to auscultation.No rales or wheezes. Normal Respiratory effort, no retractions.  Heart: NSR.  No murmurs or rubs appreciated. No periferal edema  Abdomen: Large midline abdominal mass u+2 consistent with fibroids  Extremities: Moves all appropriately.  Normal ROM for age. No lymphadenopathy.  Neuro: Oriented to PPT.  Normal mood. Normal affect.     Pelvic:   Vulva: Normal appearance.  No lesions.  Vagina: No lesions or abnormalities noted.  Support: Normal pelvic support.  Urethra No masses tenderness or scarring.  Meatus Normal size without lesions or prolapse.  Cervix: Normal ectropion.  No lesions.  Anus: Normal exam.  No lesions.  Perineum: Normal exam.  No lesions.        Bimanual   Uterus: 22-week plus size  Adnexae: No masses.  Non-tender to palpation.  Cul-de-sac: Negative for abnormality.   Assessment:   [id number] Patient Active Problem List   Diagnosis Date Noted   Moderate persistent asthma without complication 89/38/1017   Iron deficiency anemia due to chronic blood loss 05/28/2022   Abuse, adult emotional 05/28/2022   Nodular goiter 05/28/2022   Fibroid uterus 02/2022    1. Fibroid   2. Abnormal uterine bleeding      Plan:   Orders: No orders of the defined types were placed in this encounter.    1.  TAH bilateral salpingectomy  Pre-op discussions regarding Risks and Benefits of her scheduled surgery.  TAH The procedure of Total Abdominal Hysterectomy was described to the patient in detail.  We reviewed the rationale for Hysterectomy and the patient was again informed of other non-surgical management possibilities for her condition.  She has considered these other options, and desires a Hysterectomy.  We have reviewed the fact that Hysterectomy is permanent and that following the procedure she will not be able to become pregnant  or bear children.  We have discussed the following risk factors specifically, and the patient has also been informed that additional complications not mentioned may develop;  damage to bowel, bladder, ureters, or to other internal organs, bleeding, infection and the risk from anesthesia.  We have discussed the procedure itself in detail and she has an informed understanding of this surgery.  We have also discussed the recovery period in which physical and sexual activity will be restricted for a varying degree of time, often 3 - 6 weeks.  I have answered all of her questions and I believe she has an informed understanding of Abdominal Hysterectomy. Oophorectomy The option of Oophorectomy has been discussed with the patient.  Detailed risk/benefits have been reviewed.  The risks discussed include, but are not limited to, hemorrhage, infection, damage to ureter or other internal  organ, and Ovarian Remnant Syndrome.  The benefits include a significant decrease in the risk of Ovarian Cancer and in benign Ovarian disease.  The risk of Ovarian CA has been estimated at 1 in 67.  This is a relatively small risk.  However, should Ovarian CA develop, it is often found late in the course of the disease.  We have also discussed the role of inheritance in the development of Ovarian disease.  Some women, who have close relatives with Ovarian CA, have a higher than 1 in 70 risk of Ovarian CA.  The benefits of Estrogen replacement therapy following Oophorectomy has been stressed.  If she is premenopausal, we have discussed the fact that this procedure will make her permanently sterile and that premature menopause will result if no ERT is begun.  I have answered all of her questions, and I believe that she has an adequate and informed understanding of the risks and benefits of Oophorectomy. She has specifically requested to keep her ovaries.  I spent 33 minutes involved in the care of this patient preparing to see the patient  by obtaining and reviewing her medical history (including labs, imaging tests and prior procedures), documenting clinical information in the electronic health record (EHR), counseling and coordinating care plans, writing and sending prescriptions, ordering tests or procedures and in direct communicating with the patient and medical staff discussing pertinent items from her history and physical exam.  Finis Bud, M.D. 06/14/2022 12:25 PM

## 2022-06-14 NOTE — Progress Notes (Signed)
Patient presents today for a pre-op exam prior to hysterectomy due to fibroids. She states no additional concerns today.

## 2022-06-17 ENCOUNTER — Other Ambulatory Visit: Payer: Self-pay

## 2022-06-18 LAB — CBC WITH DIFF/PLATELET
Basophils Absolute: 0.1 10*3/uL (ref 0.0–0.2)
Basos: 1 %
EOS (ABSOLUTE): 0.6 10*3/uL — ABNORMAL HIGH (ref 0.0–0.4)
Eos: 11 %
Hematocrit: 44.2 % (ref 34.0–46.6)
Hemoglobin: 14.6 g/dL (ref 11.1–15.9)
Immature Grans (Abs): 0 10*3/uL (ref 0.0–0.1)
Immature Granulocytes: 0 %
Lymphocytes Absolute: 1.7 10*3/uL (ref 0.7–3.1)
Lymphs: 33 %
MCH: 29.4 pg (ref 26.6–33.0)
MCHC: 33 g/dL (ref 31.5–35.7)
MCV: 89 fL (ref 79–97)
Monocytes Absolute: 0.5 10*3/uL (ref 0.1–0.9)
Monocytes: 9 %
Neutrophils Absolute: 2.4 10*3/uL (ref 1.4–7.0)
Neutrophils: 46 %
Platelets: 245 10*3/uL (ref 150–450)
RBC: 4.97 x10E6/uL (ref 3.77–5.28)
RDW: 13.2 % (ref 11.7–15.4)
WBC: 5.3 10*3/uL (ref 3.4–10.8)

## 2022-06-26 ENCOUNTER — Telehealth: Payer: Self-pay

## 2022-06-26 MED ORDER — METRONIDAZOLE 500 MG PO TABS: 500.0000 mg | ORAL_TABLET | Freq: Two times a day (BID) | ORAL | 0 refills | Status: DC

## 2022-06-26 NOTE — Telephone Encounter (Signed)
Reaching out to surgery coordinator for date confirmation

## 2022-06-26 NOTE — Addendum Note (Signed)
Addended by: Finis Bud on: 06/26/2022 03:54 PM   Modules accepted: Orders

## 2022-06-26 NOTE — Telephone Encounter (Signed)
Pt calling triage needing to know when the medication was going to be sent into her pharmacy, She states she was suppose to start a medication 5 days before her surgery also she needs to know what time is the surgery.

## 2022-06-26 NOTE — Telephone Encounter (Signed)
Pt is calling triage back again following up on this.

## 2022-06-26 NOTE — Telephone Encounter (Signed)
Spoke with patient and made her aware medication has been sent in and that her surgery is scheduled for 10/9. Advised patient that pre admit will be in touch regarding the time to be there. All questions answered.

## 2022-06-28 ENCOUNTER — Telehealth: Payer: Self-pay | Admitting: Obstetrics and Gynecology

## 2022-06-28 ENCOUNTER — Encounter
Admission: RE | Admit: 2022-06-28 | Discharge: 2022-06-28 | Disposition: A | Discharge status: Home / Self Care | Payer: Self-pay | Source: Ambulatory Visit | Attending: Obstetrics and Gynecology | Admitting: Obstetrics and Gynecology

## 2022-06-28 DIAGNOSIS — Z01818 Encounter for other preprocedural examination: Secondary | ICD-10-CM

## 2022-06-28 HISTORY — DX: Nontoxic goiter, unspecified: E04.9

## 2022-06-28 HISTORY — DX: Anemia, unspecified: D64.9

## 2022-06-28 NOTE — Telephone Encounter (Signed)
Left message for patient to call office back to set up a 1 wk post op with Dr. Amalia Hailey. Surgery is scheduled for 07/01/2022

## 2022-06-28 NOTE — Patient Instructions (Addendum)
Your procedure is scheduled on:07-01-22 Monday Report to the Registration Desk on the 1st floor of the [facility].Then proceed to the 2nd floor Surgery Desk. Arrive at 11 AM  REMEMBER: Instructions that are not followed completely may result in serious medical risk, up to and including death; or upon the discretion of your surgeon and anesthesiologist your surgery may need to be rescheduled.  Do not eat food after midnight the night before surgery.  No gum chewing, lozengers or hard candies.  You may however, drink CLEAR liquids up to 2 hours before you are scheduled to arrive for your surgery. Do not drink anything within 2 hours of your scheduled arrival time.  Clear liquids include: - water  - apple juice without pulp - gatorade (not RED colors) - black coffee or tea (Do NOT add milk or creamers to the coffee or tea) Do NOT drink anything that is not on this list.  Do NOT take any medication the day of surgery  Use your Albuterol Inhaler the day of surgery and bring your Albuterol Inhaler to the hospital  One week prior to surgery: Stop Anti-inflammatories (NSAIDS) such as Advil, Aleve, Ibuprofen, Motrin, Naproxen, Naprosyn and Aspirin based products such as Excedrin, Goodys Powder, BC Powder.You may however, continue to take Tylenol if needed for pain up until the day of surgery.  Stop ANY OVER THE COUNTER supplements/vitamins NOW (06-28-22) until after surgery (vitamin D and Ferrous Sulfate)  No Alcohol for 24 hours before or after surgery.  No Smoking including e-cigarettes for 24 hours prior to surgery.  No chewable tobacco products for at least 6 hours prior to surgery.  No nicotine patches on the day of surgery.  Do not use any "recreational" drugs for at least a week prior to your surgery.  Please be advised that the combination of cocaine and anesthesia may have negative outcomes, up to and including death. If you test positive for cocaine, your surgery will be  cancelled.  On the morning of surgery brush your teeth with toothpaste and water, you may rinse your mouth with mouthwash if you wish. Do not swallow any toothpaste or mouthwash.  Do not wear jewelry, make-up, hairpins, clips or nail polish.  Do not wear lotions, powders, or perfumes.   Do not shave body from the neck down 48 hours prior to surgery just in case you cut yourself which could leave a site for infection.  Also, freshly shaved skin may become irritated if using the CHG soap.  Contact lenses, hearing aids and dentures may not be worn into surgery.  Do not bring valuables to the hospital. [facility] is not responsible for any missing/lost belongings or valuables.  Notify your doctor if there is any change in your medical condition (cold, fever, infection).  Wear comfortable clothing (specific to your surgery type) to the hospital.  After surgery, you can help prevent lung complications by doing breathing exercises.  Take deep breaths and cough every 1-2 hours. Your doctor may order a device called an Incentive Spirometer to help you take deep breaths. When coughing or sneezing, hold a pillow firmly against your incision with both hands. This is called "splinting." Doing this helps protect your incision. It also decreases belly discomfort.  If you are being admitted to the hospital overnight, leave your suitcase in the car. After surgery it may be brought to your room.  If you are being discharged the day of surgery, you will not be allowed to drive home. You will  need a responsible adult (18 years or older) to drive you home and stay with you that night.   If you are taking public transportation, you will need to have a responsible adult (18 years or older) with you. Please confirm with your physician that it is acceptable to use public transportation.   Please call the [facility] at [phone] if you have any questions about these  instructions.  Surgery Visitation Policy:  Patients undergoing a surgery or procedure may have two family members or support persons with them as long as the person is not COVID-19 positive or experiencing its symptoms.   Inpatient Visitation:    Visiting hours are 7 a.m. to 8 p.m. Up to four visitors are allowed at one time in a patient room, including children. The visitors may rotate out with other people during the day. One designated support person (adult) may remain overnight.

## 2022-07-01 ENCOUNTER — Encounter
Admission: RE | Disposition: A | Discharge status: Home / Self Care | Payer: Self-pay | Source: Home / Self Care | Attending: Obstetrics and Gynecology

## 2022-07-01 ENCOUNTER — Inpatient Hospital Stay: Payer: Self-pay | Admitting: Anesthesiology

## 2022-07-01 ENCOUNTER — Other Ambulatory Visit: Payer: Self-pay

## 2022-07-01 ENCOUNTER — Encounter: Payer: Self-pay | Admitting: Obstetrics and Gynecology

## 2022-07-01 ENCOUNTER — Inpatient Hospital Stay
Admission: RE | Admit: 2022-07-01 | Discharge: 2022-07-02 | DRG: 743 | Disposition: A | Discharge status: Home / Self Care | Payer: Self-pay | Attending: Obstetrics and Gynecology | Admitting: Obstetrics and Gynecology

## 2022-07-01 DIAGNOSIS — N8003 Adenomyosis of the uterus: Secondary | ICD-10-CM

## 2022-07-01 DIAGNOSIS — D259 Leiomyoma of uterus, unspecified: Principal | ICD-10-CM | POA: Diagnosis present

## 2022-07-01 DIAGNOSIS — N938 Other specified abnormal uterine and vaginal bleeding: Secondary | ICD-10-CM | POA: Diagnosis present

## 2022-07-01 DIAGNOSIS — N939 Abnormal uterine and vaginal bleeding, unspecified: Secondary | ICD-10-CM

## 2022-07-01 DIAGNOSIS — Z9889 Other specified postprocedural states: Principal | ICD-10-CM

## 2022-07-01 DIAGNOSIS — D219 Benign neoplasm of connective and other soft tissue, unspecified: Secondary | ICD-10-CM

## 2022-07-01 DIAGNOSIS — J4540 Moderate persistent asthma, uncomplicated: Secondary | ICD-10-CM | POA: Diagnosis present

## 2022-07-01 DIAGNOSIS — E049 Nontoxic goiter, unspecified: Secondary | ICD-10-CM | POA: Diagnosis present

## 2022-07-01 DIAGNOSIS — D500 Iron deficiency anemia secondary to blood loss (chronic): Secondary | ICD-10-CM | POA: Diagnosis present

## 2022-07-01 DIAGNOSIS — N920 Excessive and frequent menstruation with regular cycle: Secondary | ICD-10-CM | POA: Diagnosis present

## 2022-07-01 DIAGNOSIS — Z01818 Encounter for other preprocedural examination: Secondary | ICD-10-CM

## 2022-07-01 HISTORY — PX: HYSTERECTOMY ABDOMINAL WITH SALPINGECTOMY: SHX6725

## 2022-07-01 LAB — TYPE AND SCREEN
ABO/RH(D): O POS
Antibody Screen: NEGATIVE

## 2022-07-01 LAB — CBC
HCT: 41.6 % (ref 36.0–46.0)
Hemoglobin: 14 g/dL (ref 12.0–15.0)
MCH: 29.4 pg (ref 26.0–34.0)
MCHC: 33.7 g/dL (ref 30.0–36.0)
MCV: 87.4 fL (ref 80.0–100.0)
Platelets: 233 10*3/uL (ref 150–400)
RBC: 4.76 MIL/uL (ref 3.87–5.11)
RDW: 12.6 % (ref 11.5–15.5)
WBC: 4.1 10*3/uL (ref 4.0–10.5)
nRBC: 0 % (ref 0.0–0.2)

## 2022-07-01 LAB — POCT PREGNANCY, URINE: Preg Test, Ur: NEGATIVE

## 2022-07-01 SURGERY — HYSTERECTOMY, TOTAL, ABDOMINAL, WITH SALPINGECTOMY
Anesthesia: General | Site: Abdomen | Laterality: Bilateral

## 2022-07-01 MED ORDER — FENTANYL CITRATE (PF) 100 MCG/2ML IJ SOLN
INTRAMUSCULAR | Status: AC
  Filled 2022-07-01: qty 2

## 2022-07-01 MED ORDER — GLYCOPYRROLATE 0.2 MG/ML IJ SOLN
INTRAMUSCULAR | Status: DC | PRN
  Administered 2022-07-01: .2 mg via INTRAVENOUS

## 2022-07-01 MED ORDER — PROPOFOL 10 MG/ML IV BOLUS
INTRAVENOUS | Status: DC | PRN
  Administered 2022-07-01: 200 mg via INTRAVENOUS

## 2022-07-01 MED ORDER — DEXAMETHASONE SODIUM PHOSPHATE 10 MG/ML IJ SOLN
INTRAMUSCULAR | Status: DC | PRN
  Administered 2022-07-01: 10 mg via INTRAVENOUS

## 2022-07-01 MED ORDER — ORAL CARE MOUTH RINSE: 15.0000 mL | Freq: Once | OROMUCOSAL | Status: AC

## 2022-07-01 MED ORDER — CHLORHEXIDINE GLUCONATE 0.12 % MT SOLN
OROMUCOSAL | Status: AC
  Administered 2022-07-01: 15 mL via OROMUCOSAL
  Filled 2022-07-01: qty 15

## 2022-07-01 MED ORDER — POVIDONE-IODINE 10 % EX SWAB
2.0000 | Freq: Once | CUTANEOUS | Status: AC
  Administered 2022-07-01: 2 via TOPICAL

## 2022-07-01 MED ORDER — SUGAMMADEX SODIUM 200 MG/2ML IV SOLN
INTRAVENOUS | Status: DC | PRN
  Administered 2022-07-01: 300 mg via INTRAVENOUS

## 2022-07-01 MED ORDER — LACTATED RINGERS IV SOLN: INTRAVENOUS | Status: DC

## 2022-07-01 MED ORDER — CEFAZOLIN SODIUM-DEXTROSE 2-4 GM/100ML-% IV SOLN
INTRAVENOUS | Status: AC
  Filled 2022-07-01: qty 100

## 2022-07-01 MED ORDER — FENTANYL CITRATE (PF) 100 MCG/2ML IJ SOLN
INTRAMUSCULAR | Status: DC | PRN
  Administered 2022-07-01: 50 ug via INTRAVENOUS
  Administered 2022-07-01: 100 ug via INTRAVENOUS
  Administered 2022-07-01: 50 ug via INTRAVENOUS

## 2022-07-01 MED ORDER — ACETAMINOPHEN 500 MG PO TABS
ORAL_TABLET | ORAL | Status: AC
  Administered 2022-07-01: 1000 mg via ORAL
  Filled 2022-07-01: qty 2

## 2022-07-01 MED ORDER — ACETAMINOPHEN 500 MG PO TABS: 1000.0000 mg | ORAL_TABLET | ORAL | Status: AC

## 2022-07-01 MED ORDER — ROCURONIUM BROMIDE 100 MG/10ML IV SOLN
INTRAVENOUS | Status: DC | PRN
  Administered 2022-07-01: 50 mg via INTRAVENOUS

## 2022-07-01 MED ORDER — SIMETHICONE 80 MG PO CHEW
80.0000 mg | CHEWABLE_TABLET | Freq: Four times a day (QID) | ORAL | Status: DC | PRN
  Administered 2022-07-02: 80 mg via ORAL
  Filled 2022-07-01: qty 1

## 2022-07-01 MED ORDER — MIDAZOLAM HCL 2 MG/2ML IJ SOLN
INTRAMUSCULAR | Status: AC
  Filled 2022-07-01: qty 2

## 2022-07-01 MED ORDER — KETOROLAC TROMETHAMINE 30 MG/ML IJ SOLN
30.0000 mg | Freq: Four times a day (QID) | INTRAMUSCULAR | Status: DC
  Administered 2022-07-01 – 2022-07-02 (×4): 30 mg via INTRAVENOUS
  Filled 2022-07-01 (×4): qty 1

## 2022-07-01 MED ORDER — FENTANYL CITRATE (PF) 100 MCG/2ML IJ SOLN
25.0000 ug | INTRAMUSCULAR | Status: DC | PRN
  Administered 2022-07-01 (×2): 50 ug via INTRAVENOUS

## 2022-07-01 MED ORDER — MIDAZOLAM HCL 2 MG/2ML IJ SOLN
INTRAMUSCULAR | Status: DC | PRN
  Administered 2022-07-01: 2 mg via INTRAVENOUS

## 2022-07-01 MED ORDER — OXYCODONE HCL 5 MG PO TABS
5.0000 mg | ORAL_TABLET | Freq: Once | ORAL | Status: AC | PRN
  Administered 2022-07-01: 5 mg via ORAL

## 2022-07-01 MED ORDER — DEXTROSE IN LACTATED RINGERS 5 % IV SOLN: INTRAVENOUS | Status: DC

## 2022-07-01 MED ORDER — FAMOTIDINE 20 MG PO TABS
20.0000 mg | ORAL_TABLET | Freq: Once | ORAL | Status: AC
  Administered 2022-07-01: 20 mg via ORAL

## 2022-07-01 MED ORDER — ESMOLOL HCL 100 MG/10ML IV SOLN
INTRAVENOUS | Status: DC | PRN
  Administered 2022-07-01: 10 mg via INTRAVENOUS

## 2022-07-01 MED ORDER — HYDROMORPHONE HCL 1 MG/ML IJ SOLN
INTRAMUSCULAR | Status: DC | PRN
  Administered 2022-07-01: 1 mg via INTRAVENOUS

## 2022-07-01 MED ORDER — OXYCODONE-ACETAMINOPHEN 5-325 MG PO TABS
1.0000 | ORAL_TABLET | ORAL | Status: DC | PRN
  Administered 2022-07-01: 1 via ORAL
  Administered 2022-07-02: 2 via ORAL
  Filled 2022-07-01: qty 1
  Filled 2022-07-01: qty 2

## 2022-07-01 MED ORDER — FAMOTIDINE 20 MG PO TABS
ORAL_TABLET | ORAL | Status: AC
  Filled 2022-07-01: qty 1

## 2022-07-01 MED ORDER — LIDOCAINE 5 % EX PTCH
MEDICATED_PATCH | CUTANEOUS | Status: DC | PRN
  Administered 2022-07-01: 1 via TRANSDERMAL

## 2022-07-01 MED ORDER — GABAPENTIN 300 MG PO CAPS
ORAL_CAPSULE | ORAL | Status: AC
  Administered 2022-07-01: 300 mg via ORAL
  Filled 2022-07-01: qty 1

## 2022-07-01 MED ORDER — ACETAMINOPHEN 10 MG/ML IV SOLN
INTRAVENOUS | Status: AC
  Filled 2022-07-01: qty 100

## 2022-07-01 MED ORDER — HYDROMORPHONE HCL 1 MG/ML IJ SOLN
1.0000 mg | Freq: Once | INTRAMUSCULAR | Status: AC
  Administered 2022-07-01: 1 mg via INTRAVENOUS
  Filled 2022-07-01: qty 1

## 2022-07-01 MED ORDER — CEFAZOLIN SODIUM-DEXTROSE 2-4 GM/100ML-% IV SOLN
2.0000 g | INTRAVENOUS | Status: AC
  Administered 2022-07-01: 2 g via INTRAVENOUS

## 2022-07-01 MED ORDER — GABAPENTIN 300 MG PO CAPS: 300.0000 mg | ORAL_CAPSULE | ORAL | Status: AC

## 2022-07-01 MED ORDER — LIDOCAINE 5 % EX PTCH
MEDICATED_PATCH | CUTANEOUS | Status: AC
  Filled 2022-07-01: qty 1

## 2022-07-01 MED ORDER — LIDOCAINE HCL (CARDIAC) PF 100 MG/5ML IV SOSY
PREFILLED_SYRINGE | INTRAVENOUS | Status: DC | PRN
  Administered 2022-07-01: 100 mg via INTRAVENOUS

## 2022-07-01 MED ORDER — OXYCODONE HCL 5 MG/5ML PO SOLN: 5.0000 mg | Freq: Once | ORAL | Status: AC | PRN

## 2022-07-01 MED ORDER — HYDROMORPHONE HCL 1 MG/ML IJ SOLN
INTRAMUSCULAR | Status: AC
  Filled 2022-07-01: qty 1

## 2022-07-01 MED ORDER — CHLORHEXIDINE GLUCONATE 0.12 % MT SOLN: 15.0000 mL | Freq: Once | OROMUCOSAL | Status: AC

## 2022-07-01 MED ORDER — OXYCODONE HCL 5 MG PO TABS
ORAL_TABLET | ORAL | Status: AC
  Filled 2022-07-01: qty 1

## 2022-07-01 MED ORDER — ONDANSETRON HCL 4 MG/2ML IJ SOLN
INTRAMUSCULAR | Status: DC | PRN
  Administered 2022-07-01: 4 mg via INTRAVENOUS

## 2022-07-01 MED ORDER — HYDROMORPHONE HCL 1 MG/ML IJ SOLN: 0.5000 mg | INTRAMUSCULAR | Status: DC | PRN

## 2022-07-01 MED ORDER — DEXMEDETOMIDINE HCL IN NACL 200 MCG/50ML IV SOLN
INTRAVENOUS | Status: DC | PRN
  Administered 2022-07-01: 8 ug via INTRAVENOUS

## 2022-07-01 SURGICAL SUPPLY — 38 items
ADHESIVE MASTISOL STRL (MISCELLANEOUS) ×1 IMPLANT
BLADE SURG SZ10 CARB STEEL (BLADE) ×1 IMPLANT
CHLORAPREP W/TINT 26 (MISCELLANEOUS) ×1 IMPLANT
DRAPE LAPAROTOMY 100X77 ABD (DRAPES) IMPLANT
DRAPE LAPAROTOMY TRNSV 106X77 (MISCELLANEOUS) IMPLANT
DRSG TELFA 3X8 NADH STRL (GAUZE/BANDAGES/DRESSINGS) ×1 IMPLANT
ELECT BLADE 6 FLAT ULTRCLN (ELECTRODE) ×1 IMPLANT
ELECT BLADE 6.5 EXT (BLADE) ×1 IMPLANT
ELECT CAUTERY BLADE 6.4 (BLADE) ×1 IMPLANT
ELECT REM PT RETURN 9FT ADLT (ELECTROSURGICAL) ×1
ELECTRODE REM PT RTRN 9FT ADLT (ELECTROSURGICAL) ×1 IMPLANT
GAUZE 4X4 16PLY ~~LOC~~+RFID DBL (SPONGE) ×2 IMPLANT
GAUZE SPONGE 4X4 12PLY STRL (GAUZE/BANDAGES/DRESSINGS) ×1 IMPLANT
GLOVE PI ORTHO PRO STRL 7.5 (GLOVE) ×2 IMPLANT
GOWN STRL REUS W/ TWL XL LVL3 (GOWN DISPOSABLE) ×2 IMPLANT
GOWN STRL REUS W/TWL XL LVL3 (GOWN DISPOSABLE) ×2
KIT TURNOVER KIT A (KITS) ×1 IMPLANT
LABEL OR SOLS (LABEL) ×1 IMPLANT
LIGASURE IMPACT 36 18CM CVD LR (INSTRUMENTS) IMPLANT
MANIFOLD NEPTUNE II (INSTRUMENTS) ×1 IMPLANT
NS IRRIG 1000ML POUR BTL (IV SOLUTION) ×1 IMPLANT
PACK BASIN MAJOR ARMC (MISCELLANEOUS) ×1 IMPLANT
PAD OB MATERNITY 4.3X12.25 (PERSONAL CARE ITEMS) ×1 IMPLANT
RETRACTOR WND ALEXIS-O 25 LRG (MISCELLANEOUS) IMPLANT
RTRCTR WOUND ALEXIS O 25CM LRG (MISCELLANEOUS) ×1
SCRUB CHG 4% DYNA-HEX 4OZ (MISCELLANEOUS) ×1 IMPLANT
SPONGE T-LAP 18X18 ~~LOC~~+RFID (SPONGE) ×3 IMPLANT
STAPLER SKIN PROX 35W (STAPLE) IMPLANT
STRIP CLOSURE SKIN 1/2X4 (GAUZE/BANDAGES/DRESSINGS) ×1 IMPLANT
SUT VIC AB 1 CT1 18XCR BRD 8 (SUTURE) ×1 IMPLANT
SUT VIC AB 1 CT1 36 (SUTURE) ×2 IMPLANT
SUT VIC AB 1 CT1 8-18 (SUTURE) ×1
SUT VICRYL AB 3-0 FS1 BRD 27IN (SUTURE) ×2 IMPLANT
SUT VICRYL+ 3-0 36IN CT-1 (SUTURE) ×1 IMPLANT
TOWEL OR 17X26 4PK STRL BLUE (TOWEL DISPOSABLE) ×1 IMPLANT
TRAP FLUID SMOKE EVACUATOR (MISCELLANEOUS) ×1 IMPLANT
TRAY FOLEY MTR SLVR 16FR STAT (SET/KITS/TRAYS/PACK) ×1 IMPLANT
WATER STERILE IRR 500ML POUR (IV SOLUTION) ×1 IMPLANT

## 2022-07-01 NOTE — Anesthesia Preprocedure Evaluation (Signed)
Anesthesia Evaluation  Patient identified by MRN, date of birth, ID band Patient awake    Reviewed: Allergy & Precautions, NPO status , Patient's Chart, lab work & pertinent test results  History of Anesthesia Complications Negative for: history of anesthetic complications  Airway Mallampati: III  TM Distance: >3 FB Neck ROM: full    Dental  (+) Chipped, Poor Dentition, Missing   Pulmonary neg shortness of breath, asthma ,    Pulmonary exam normal        Cardiovascular Exercise Tolerance: Good (-) anginanegative cardio ROS Normal cardiovascular exam     Neuro/Psych negative neurological ROS  negative psych ROS   GI/Hepatic negative GI ROS, Neg liver ROS, neg GERD  ,  Endo/Other  negative endocrine ROS  Renal/GU      Musculoskeletal   Abdominal   Peds  Hematology negative hematology ROS (+)   Anesthesia Other Findings Past Medical History: No date: Anemia No date: Asthma No date: Blood transfusion without reported diagnosis 02/2022: Fibroid uterus No date: Nodular goiter  Past Surgical History: 03/21/2022: ENDOMETRIAL BIOPSY     Comment:  Benign-done in office  BMI    Body Mass Index: 25.46 kg/m      Reproductive/Obstetrics negative OB ROS                             Anesthesia Physical Anesthesia Plan  ASA: 3  Anesthesia Plan: General ETT   Post-op Pain Management:    Induction: Intravenous  PONV Risk Score and Plan: Ondansetron, Dexamethasone, Midazolam and Treatment may vary due to age or medical condition  Airway Management Planned: Oral ETT  Additional Equipment:   Intra-op Plan:   Post-operative Plan: Extubation in OR  Informed Consent: I have reviewed the patients History and Physical, chart, labs and discussed the procedure including the risks, benefits and alternatives for the proposed anesthesia with the patient or authorized representative who has  indicated his/her understanding and acceptance.     Dental Advisory Given  Plan Discussed with: Anesthesiologist, CRNA and Surgeon  Anesthesia Plan Comments: (Patient consented for risks of anesthesia including but not limited to:  - adverse reactions to medications - damage to eyes, teeth, lips or other oral mucosa - nerve damage due to positioning  - sore throat or hoarseness - Damage to heart, brain, nerves, lungs, other parts of body or loss of life  Patient voiced understanding.)        Anesthesia Quick Evaluation

## 2022-07-01 NOTE — Anesthesia Postprocedure Evaluation (Signed)
Anesthesia Post Note  Patient: Kirsten Brown  Procedure(s) Performed: HYSTERECTOMY ABDOMINAL WITH SALPINGECTOMY (Bilateral: Abdomen)  Patient location during evaluation: PACU Anesthesia Type: General Level of consciousness: awake and alert Pain management: pain level controlled Vital Signs Assessment: post-procedure vital signs reviewed and stable Respiratory status: spontaneous breathing, nonlabored ventilation, respiratory function stable and patient connected to nasal cannula oxygen Cardiovascular status: blood pressure returned to baseline and stable Postop Assessment: no apparent nausea or vomiting Anesthetic complications: no   No notable events documented.   Last Vitals:  Vitals:   07/01/22 1724 07/01/22 1822  BP: 120/77 113/74  Pulse: 62 72  Resp: 18 16  Temp: 36.4 C   SpO2: 99% 99%    Last Pain:  Vitals:   07/01/22 1810  TempSrc:   PainSc: Asleep                 Arita Miss

## 2022-07-01 NOTE — Op Note (Signed)
    OPERATIVE NOTE 07/01/2022 3:16 PM  PRE-OPERATIVE DIAGNOSIS:  1) large uterine fibroids, dysfunctional uterine bleeding  POST-OPERATIVE DIAGNOSIS:  large uterine fibroids, dysfunctional uterine bleeding  OPERATION: Total Abdominal Hysterectomy with Bilateral Salpingectomy  SURGEON(S): Surgeon(s) and Role:    Harlin Heys, MD - Primary    Rubie Maid, MD - Assisting No other capable assistant was available for this surgery which requires an experienced, high level assistant.   ASSISTANT:   Cherry MD  ANESTHESIA: General  ESTIMATED BLOOD LOSS: 50 mL  OPERATIVE FINDINGS: Large uterine fibroids  SPECIMEN:  ID Type Source Tests Collected by Time Destination  1 : uterus, bilateral tubes, cervix GYN Uterus, Cervix, and Bilateral Fallopian Tubes SURGICAL PATHOLOGY Harlin Heys, MD 28/03/8675 [id number]     COMPLICATIONS: None  DRAINS: Foley to gravity  DISPOSITION: Stable to recovery room  DESCRIPTION OF PROCEDURE: The patient was prepped and draped in the supine position and placed under spinal anesthesia.  A transverse incision was made across the abdomen in a Pfannenstiel manner. We carried the dissection down to the level of the fascia.  The fascia was incised in a curvilinear manner.  The fascia was then elevated from the rectus muscles with blunt and sharp dissection.  The rectus muscles were separated laterally exposing the peritoneum.  The peritoneum was carefully entered with care being taken to avoid bowel and bladder.  A self-retaining retractor was placed. The uterus was grasped and elevated.  The left and right round ligaments were identified and fulgurated.  The anterior portion of the broad ligament was coagulated and divided with the ligasure. The bladder was taken down along the cervix mobilizing the bladder from the cervix.  A window was made in the broad ligament and the infundibulopelvic ligaments were carefully identified. They were coagulated and  divided with the ligasure. Hemostasis of the pedicles was noted. The uterine arteries were skeletonized. The uterine arteries were then clamped coagulated and divided. A stab incision was made into the vagina and the cervix was cut free of the vaginal mucosa in a circumferential manner using the angled Jergensen scissors. The vaginal cuff was identified on Kocher clamps. Richardson angle sutures were placed in the usual manner and the vaginal cuff was run and then was closed with interrupted sutures of Vicryl. Hemostasis was noted. The remainder of the pedicles were carefully inspected and found to be hemostatic. The self-retaining retaining retractor was removed and the sponges were removed from the abdomen and pelvis. Hemostasis was again noted. The fascia was then closed with a running suture of #1 Vicryl.  Hemostasis of the subcutaneous tissues was obtained using the Bovie.  The subcutaneous tissues were closed with a running suture of 000 Vicryl.  A subcuticular suture was placed.  Steri-Strips were applied in the usual manner.  A pressure dressing was placed.  The patient tolerated the procedure well.  All counts were correct.  The patient was taken to the recovery room in stable condition.  Dr. Marcelline Mates provided exposure, dissection, suctioning, retraction, and general support and assistance during the procedure.   Finis Bud, M.D. 07/01/2022 3:16 PM

## 2022-07-01 NOTE — Anesthesia Procedure Notes (Signed)
Procedure Name: Intubation Date/Time: 07/01/2022 1:51 PM  Performed by: Kelton Pillar, CRNAPre-anesthesia Checklist: Patient identified, Emergency Drugs available, Suction available and Patient being monitored Patient Re-evaluated:Patient Re-evaluated prior to induction Oxygen Delivery Method: Circle system utilized Preoxygenation: Pre-oxygenation with 100% oxygen Induction Type: IV induction Ventilation: Mask ventilation without difficulty Laryngoscope Size: McGraph and 3 Grade View: Grade I Tube type: Oral Tube size: 6.5 mm Number of attempts: 1 Airway Equipment and Method: Stylet and Oral airway Placement Confirmation: ETT inserted through vocal cords under direct vision, positive ETCO2, breath sounds checked- equal and bilateral and CO2 detector Secured at: 21 cm Tube secured with: Tape Dental Injury: Teeth and Oropharynx as per pre-operative assessment

## 2022-07-01 NOTE — Interval H&P Note (Signed)
History and Physical Interval Note:  07/01/2022 1:31 PM  Kirsten Brown  has presented today for surgery, with the diagnosis of large uterine fibroids, dysfunctional uterine bleeding.  The various methods of treatment have been discussed with the patient and family. After consideration of risks, benefits and other options for treatment, the patient has consented to  Procedure(s): HYSTERECTOMY ABDOMINAL WITH SALPINGECTOMY (Bilateral) as a surgical intervention.  The patient's history has been reviewed, patient examined, no change in status, stable for surgery.  I have reviewed the patient's chart and labs.  Questions were answered to the patient's satisfaction.     Jeannie Fend

## 2022-07-01 NOTE — Transfer of Care (Addendum)
Immediate Anesthesia Transfer of Care Note  Patient: Kirsten Brown  Procedure(s) Performed: HYSTERECTOMY ABDOMINAL WITH SALPINGECTOMY (Bilateral: Abdomen)  Patient Location: PACU  Anesthesia Type:General  Level of Consciousness: drowsy and patient cooperative  Airway & Oxygen Therapy: Patient Spontanous Breathing and Patient connected to face mask oxygen  Post-op Assessment: Report given to RN and Post -op Vital signs reviewed and stable  Post vital signs: Reviewed and stable  Last Vitals:  Vitals Value Taken Time  BP    Temp    Pulse    Resp    SpO2      Last Pain:  Vitals:   07/01/22 1127  TempSrc: Temporal  PainSc: 0-No pain         Complications: No notable events documented.

## 2022-07-02 ENCOUNTER — Encounter: Payer: Self-pay | Admitting: Obstetrics and Gynecology

## 2022-07-02 MED ORDER — IBUPROFEN 600 MG PO TABS: 600.0000 mg | ORAL_TABLET | Freq: Four times a day (QID) | ORAL | 3 refills | Status: DC | PRN

## 2022-07-02 MED ORDER — OXYCODONE-ACETAMINOPHEN 5-325 MG PO TABS: 1.0000 | ORAL_TABLET | ORAL | 0 refills | Status: DC | PRN

## 2022-07-02 NOTE — Progress Notes (Signed)
Patient discharged. Discharge instructions given. Patient verbalizes understanding. Transported by axillary. 

## 2022-07-02 NOTE — Discharge Summary (Signed)
Discharge Summary  Admit date: 07/01/2022  Discharge Date and Time:07/02/2022  1:10 PM  Discharge to:  Home  Admission Diagnosis: Large uterine fibroids                    Discharge  Diagnoses: Principal Problem:   Post-operative state   OR Procedures:   Procedure(s): HYSTERECTOMY ABDOMINAL WITH SALPINGECTOMY Date -------------------                              Discharge Day Progress Note:   Subjective:   The patient does not have complaints.  She is ambulating well. She is taking PO well. Her pain is well controlled with her current medications. She is urinating without difficulty and is passing flatus.   Objective:  BP 110/80 (BP Location: Left Arm)   Pulse 68   Temp 98.3 F (36.8 C) (Oral)   Resp 18   Ht '5\' 5"'$  (1.651 m)   Wt 69.4 kg   LMP 06/26/2022 (Exact Date)   SpO2 98%   BMI 25.46 kg/m     Abdomen:                         clean, dry, no drainage    Assessment:   Doing well.  Normal progress as expected.     Plan:        Discharge home.                       Medications as directed.  Hospital Course:  No notes on file   Condition at Discharge:  good Discharge Medications:  Allergies as of 07/02/2022       Reactions   Asa [aspirin] Shortness Of Breath        Medication List     STOP taking these medications    budesonide-formoterol 80-4.5 MCG/ACT inhaler Commonly known as: SYMBICORT   ferrous sulfate 324 MG Tbec   metroNIDAZOLE 500 MG tablet Commonly known as: FLAGYL       TAKE these medications    albuterol 108 (90 Base) MCG/ACT inhaler Commonly known as: VENTOLIN HFA Inhale 2 puffs into the lungs every 6 (six) hours as needed for wheezing or shortness of breath.   ibuprofen 600 MG tablet Commonly known as: ADVIL Take 1 tablet (600 mg total) by mouth every 6 (six) hours as needed.   oxyCODONE-acetaminophen 5-325 MG tablet Commonly known as: PERCOCET/ROXICET Take 1-2 tablets by mouth every 4 (four) hours as needed for  moderate pain.   Vitamin D 50 MCG (2000 UT) Caps Take 1,000 Units by mouth daily. 2 chews by mouth once daily.               Discharge Care Instructions  (From admission, onward)           Start     Ordered   07/02/22 0000  No dressing needed       Comments: Keep wound area clean and dry as directed   07/02/22 1310             Follow Up:    Follow-up Information     Harlin Heys, MD Follow up in 1 week(s).   Specialties: Obstetrics and Gynecology, Radiology Contact information: [address] [place] [address] [phone]  Finis Bud, M.D. 07/02/2022 1:10 PM

## 2022-07-02 NOTE — Progress Notes (Signed)
Pt ambulating without difficulty and pain is well controlled with pain medications. Pressure dressing removed and honeycomb placed per Dr. Amalia Hailey request.

## 2022-07-03 LAB — SURGICAL PATHOLOGY

## 2022-07-11 ENCOUNTER — Encounter: Payer: Self-pay | Admitting: Obstetrics and Gynecology

## 2022-07-11 ENCOUNTER — Ambulatory Visit (INDEPENDENT_AMBULATORY_CARE_PROVIDER_SITE_OTHER): Payer: Self-pay | Admitting: Obstetrics and Gynecology

## 2022-07-11 VITALS — Resp 16 | Ht 65.0 in | Wt 148.0 lb

## 2022-07-11 DIAGNOSIS — N8003 Adenomyosis of the uterus: Secondary | ICD-10-CM

## 2022-07-11 DIAGNOSIS — Z4889 Encounter for other specified surgical aftercare: Secondary | ICD-10-CM

## 2022-07-11 DIAGNOSIS — D500 Iron deficiency anemia secondary to blood loss (chronic): Secondary | ICD-10-CM

## 2022-07-11 DIAGNOSIS — Z90710 Acquired absence of both cervix and uterus: Secondary | ICD-10-CM

## 2022-07-11 DIAGNOSIS — Z8742 Personal history of other diseases of the female genital tract: Secondary | ICD-10-CM

## 2022-07-11 DIAGNOSIS — D219 Benign neoplasm of connective and other soft tissue, unspecified: Secondary | ICD-10-CM

## 2022-07-11 NOTE — Progress Notes (Signed)
    OBSTETRICS/GYNECOLOGY POST-OPERATIVE CLINIC VISIT  Subjective:     Kirsten Brown is a 46 y.o. female who presents to the clinic  1.3  weeks status post ABDOMINAL HYSTERECTOMY WITH SALPINGECTOMY for large uterine fibroids, dysfunctional uterine bleeding. Eating a regular diet with difficulty. Bowel movements are abnormal with constipation, she has only had 3 bowel movements after surgery . Pain is controlled with current analgesics. Medications being used: narcotic analgesics including oxycodone/acetaminophen (Percocet, Tylox). She reports her pain as 1/10 She feels a small pinch that comes and goes at her incision site.   The following portions of the patient's history were reviewed and updated as appropriate: allergies, current medications, past family history, past medical history, past social history, past surgical history, and problem list.  Review of Systems Pertinent items noted in HPI and remainder of comprehensive ROS otherwise negative.   Objective:   LMP 06/26/2022 (Exact Date)  Body mass index is 24.63 kg/m. Resp. rate 16, height '5\' 5"'$  (1.651 m), weight 148 lb (67.1 kg)   General:  alert and no distress  Abdomen: soft, bowel sounds active, non-tender  Incision:   healing well, no drainage, no erythema, no hernia, no seroma, no swelling, no dehiscence, incision well approximated    Labs:  Lab Results  Component Value Date   WBC 4.1 07/01/2022   HGB 14.0 07/01/2022   HCT 41.6 07/01/2022   MCV 87.4 07/01/2022   PLT 233 07/01/2022    Pathology:  A. UTERUS WITH CERVIX AND BILATERAL FALLOPIAN TUBES; TOTAL HYSTERECTOMY  WITH BILATERAL SALPINGECTOMY:  - UTERINE CERVIX:       - BENIGN TRANSFORMATION ZONE.       - NEGATIVE FOR SQUAMOUS INTRAEPITHELIAL LESION AND MALIGNANCY.  - ENDOMETRIUM:       - WEAKLY PROLIFERATIVE PHASE ENDOMETRIUM.       - NEGATIVE FOR ATYPICAL HYPERPLASIA/EIN AND MALIGNANCY.  - MYOMETRIUM:       - LEIOMYOMATA UTERI.       - ADENOMYOSIS.        - NEGATIVE FOR FEATURES OF MALIGNANCY.  - FALLOPIAN TUBES:       - NO SIGNIFICANT HISTOPATHOLOGIC CHANGE.   Assessment:   Patient s/p HYSTERECTOMY ABDOMINAL WITH SALPINGECTOMY Doing well postoperatively. Fibroid uterus Iron deficiency anemia due to chronic blood loss History of abnormal uterine bleeding Adenomyosis   Plan:   1. Continue any current medications as instructed by provider. 2. Wound care discussed. 3. Operative findings again reviewed. Pathology report discussed. 4. Activity restrictions: no bending, stooping, or squatting, no lifting more than 10-15 pounds, and pelvic rest x 5 weeks 5. Anticipated return to work:  5 weeks . 6. Follow up: 5 weeks for final post-operative visit    Rubie Maid, MD [facility]

## 2022-08-01 ENCOUNTER — Ambulatory Visit (INDEPENDENT_AMBULATORY_CARE_PROVIDER_SITE_OTHER): Payer: Self-pay | Admitting: Obstetrics and Gynecology

## 2022-08-01 ENCOUNTER — Encounter: Payer: Self-pay | Admitting: Obstetrics and Gynecology

## 2022-08-01 VITALS — BP 120/80 | HR 74 | Ht 65.0 in | Wt 151.3 lb

## 2022-08-01 DIAGNOSIS — Z4889 Encounter for other specified surgical aftercare: Secondary | ICD-10-CM

## 2022-08-01 DIAGNOSIS — R309 Painful micturition, unspecified: Secondary | ICD-10-CM

## 2022-08-01 MED ORDER — NITROFURANTOIN MONOHYD MACRO 100 MG PO CAPS: 100.0000 mg | ORAL_CAPSULE | Freq: Two times a day (BID) | ORAL | 1 refills | Status: DC

## 2022-08-01 NOTE — Progress Notes (Signed)
Patient presents for 4 week postop follow-up following hysterectomy. She states having pain with urination but no bleeding. No additional concerns.
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
## 2022-08-06 LAB — URINE CULTURE

## 2022-08-19 ENCOUNTER — Telehealth: Payer: Self-pay

## 2022-08-19 NOTE — Telephone Encounter (Signed)
Patient called to schedule an appointment. She has been experiencing pain in left side of her abdomen. She had surgery on 07/01/2022.

## 2022-08-29 ENCOUNTER — Encounter: Payer: Self-pay | Admitting: Internal Medicine

## 2022-08-29 ENCOUNTER — Ambulatory Visit: Payer: Self-pay | Admitting: Obstetrics and Gynecology

## 2022-08-29 ENCOUNTER — Ambulatory Visit: Payer: Self-pay | Admitting: Internal Medicine

## 2022-08-29 VITALS — BP 110/78 | HR 76 | Resp 16 | Ht 65.0 in | Wt 156.0 lb

## 2022-08-29 DIAGNOSIS — Z23 Encounter for immunization: Secondary | ICD-10-CM

## 2022-08-29 DIAGNOSIS — R300 Dysuria: Secondary | ICD-10-CM

## 2022-08-29 DIAGNOSIS — J4540 Moderate persistent asthma, uncomplicated: Secondary | ICD-10-CM

## 2022-08-29 DIAGNOSIS — N898 Other specified noninflammatory disorders of vagina: Secondary | ICD-10-CM

## 2022-08-29 DIAGNOSIS — Z5941 Food insecurity: Secondary | ICD-10-CM

## 2022-08-29 DIAGNOSIS — Z5986 Financial insecurity: Secondary | ICD-10-CM

## 2022-08-29 LAB — POCT URINALYSIS DIPSTICK
Bilirubin, UA: NEGATIVE
Blood, UA: NEGATIVE
Glucose, UA: NEGATIVE
Ketones, UA: NEGATIVE
Leukocytes, UA: NEGATIVE
Nitrite, UA: NEGATIVE
Protein, UA: NEGATIVE
Spec Grav, UA: 1.025 (ref 1.010–1.025)
Urobilinogen, UA: 0.2 E.U./dL
pH, UA: 6 (ref 5.0–8.0)

## 2022-08-29 LAB — POCT WET PREP WITH KOH

## 2022-08-29 MED ORDER — BUDESONIDE-FORMOTEROL FUMARATE 80-4.5 MCG/ACT IN AERO: 2.0000 | INHALATION_SPRAY | Freq: Two times a day (BID) | RESPIRATORY_TRACT | 11 refills | Status: DC

## 2022-08-29 NOTE — Progress Notes (Signed)
Subjective:    Patient ID: Kirsten Brown, female   DOB: 1976/01/09, 46 y.o.   MRN: [id number]   HPI   Asthma:  Did not get her Symbicort filled--it is no longer on patient support from the pharmaceutical company.  Jerral Ralph is very similar and can be utilized with MAP.  States using Albuterol currently twice weekly, but could use it 4 times weekly if not concerned about running out of inhaler.  She expects to get worse with the coming colder months.  She generally is having sense of a tight chest and dyspnea.  Strong odors and cold temps are her triggers.    2.  Fibroid uterus:  had TAH with salpingectomy in October.  Was treated with Macrobid at beginning of November for UTI.  Culture grew lower count lactobacillus.  Symptoms resolved.  Started having vaginal burning only when she urinates.  States she does not note any burning on vulvar area.  No discharge.  No fever.  + urinary frequency.  No flank pain.    Current Meds  Medication Sig   ibuprofen (ADVIL) 600 MG tablet Take 1 tablet (600 mg total) by mouth every 6 (six) hours as needed.   Allergies  Allergen Reactions   Asa [Aspirin] Shortness Of Breath     Review of Systems    Objective:   BP 110/78 (BP Location: Left Arm, Patient Position: Sitting, Cuff Size: Normal)   Pulse 76   Resp 16   Ht 5\' 5"  (1.651 m)   Wt 156 lb (70.8 kg)   LMP 06/26/2022 (Exact Date)   BMI 25.96 kg/m   Physical Exam HENT:     Head: Normocephalic.     Right Ear: Tympanic membrane normal.     Left Ear: Tympanic membrane normal.     Nose: Nose normal.     Mouth/Throat:     Mouth: Mucous membranes are moist.     Pharynx: Oropharynx is clear.  Eyes:     Extraocular Movements: Extraocular movements intact.     Conjunctiva/sclera: Conjunctivae normal.     Pupils: Pupils are equal, round, and reactive to light.  Neck:     Thyroid: No thyroid mass or thyromegaly.  Cardiovascular:     Rate and Rhythm: Normal rate and regular rhythm.      Pulses: Normal pulses.     Heart sounds: No murmur heard.    No friction rub.  Pulmonary:     Effort: Pulmonary effort is normal.     Breath sounds: Normal breath sounds and air entry.  Abdominal:     General: Bowel sounds are normal.     Palpations: Abdomen is soft. There is no mass.     Tenderness: There is no abdominal tenderness.     Hernia: No hernia is present.  Genitourinary:    Vagina: Vaginal discharge (thin light yellow and no odor) present. No erythema or lesions.     Adnexa: Right adnexa normal and left adnexa normal.  Musculoskeletal:     Cervical back: Normal range of motion and neck supple.  Neurological:     Mental Status: She is alert.   Wet Prep:  + clue and whiff.     Assessment & Plan   Asthma:  MAP Rx for Breyna 2 puffs twice daily to eat and then brush teeth and tongue.  2.  Vaginal discharge:  Bacterial Vaginosis.  Metronidazole 500 mg twice daily for 7 days.  GC/chlamydia sent  3.  Dysuria:  UA  normal--likely related to BV irritation.  4.   HM:  Spikevax COVID vaccination.  5.  Financial and food insecurity:  referral to [facility]

## 2022-08-31 LAB — URINE CULTURE

## 2022-09-03 LAB — GC/CHLAMYDIA PROBE AMP
Chlamydia trachomatis, NAA: NEGATIVE
Neisseria Gonorrhoeae by PCR: NEGATIVE

## 2022-09-03 MED ORDER — METRONIDAZOLE 500 MG PO TABS: ORAL_TABLET | ORAL | 0 refills | Status: DC

## 2022-10-01 ENCOUNTER — Encounter: Payer: Self-pay | Admitting: Obstetrics and Gynecology

## 2022-10-01 ENCOUNTER — Ambulatory Visit (INDEPENDENT_AMBULATORY_CARE_PROVIDER_SITE_OTHER): Payer: Self-pay | Admitting: Obstetrics and Gynecology

## 2022-10-01 ENCOUNTER — Other Ambulatory Visit (HOSPITAL_COMMUNITY)
Admission: RE | Admit: 2022-10-01 | Discharge: 2022-10-01 | Disposition: A | Discharge status: Home / Self Care | Payer: Self-pay | Source: Ambulatory Visit | Attending: Obstetrics and Gynecology | Admitting: Obstetrics and Gynecology

## 2022-10-01 VITALS — BP 132/86 | HR 81 | Ht 65.0 in | Wt 156.6 lb

## 2022-10-01 DIAGNOSIS — R300 Dysuria: Secondary | ICD-10-CM

## 2022-10-01 DIAGNOSIS — R109 Unspecified abdominal pain: Secondary | ICD-10-CM

## 2022-10-01 DIAGNOSIS — N898 Other specified noninflammatory disorders of vagina: Secondary | ICD-10-CM | POA: Insufficient documentation

## 2022-10-01 LAB — POCT URINALYSIS DIPSTICK
Bilirubin, UA: NEGATIVE
Blood, UA: NEGATIVE
Glucose, UA: NEGATIVE
Ketones, UA: NEGATIVE
Leukocytes, UA: NEGATIVE
Nitrite, UA: NEGATIVE
Protein, UA: NEGATIVE
Spec Grav, UA: 1.02 (ref 1.010–1.025)
Urobilinogen, UA: 0.2 E.U./dL
pH, UA: 6 (ref 5.0–8.0)

## 2022-10-01 NOTE — Progress Notes (Signed)
HPI:      [name] is a 47 y.o. [phone] who LMP was Patient's last menstrual period was 06/26/2022 (exact date).  Subjective:   She presents today because she is experiencing almost daily pain with urination.  She also states that she has trouble emptying and has to lean forward to complete urination.  She has recently been treated twice for UTIs and wants for BV.  She complains of a white thick vaginal discharge at this time. Of significant note patient underwent hysterectomy and October (abdominal)    Hx: The following portions of the patient's history were reviewed and updated as appropriate:             She  has a past medical history of Anemia, Asthma, Blood transfusion without reported diagnosis, Fibroid uterus (02/2022), and Nodular goiter. She does not have any pertinent problems on file. She  has a past surgical history that includes Endometrial biopsy (03/21/2022) and Hysterectomy abdominal with salpingectomy (Bilateral, 07/01/2022). Her family history includes Allergies in her son; Cancer in her brother; Diabetes in her father; Heart disease in her mother; Hypertension in her father and mother; Other in her sister. She  reports that she has never smoked. She has never been exposed to tobacco smoke. She has never used smokeless tobacco. She reports current alcohol use. She reports that she does not use drugs. She has a current medication list which includes the following prescription(s): albuterol, budesonide-formoterol, and ibuprofen. She is allergic to asa [aspirin].       Review of Systems:  Review of Systems  Constitutional: Denied constitutional symptoms, night sweats, recent illness, fatigue, fever, insomnia and weight loss.  Eyes: Denied eye symptoms, eye pain, photophobia, vision change and visual disturbance.  Ears/Nose/Throat/Neck: Denied ear, nose, throat or neck symptoms, hearing loss, nasal discharge, sinus congestion and sore throat.  Cardiovascular: Denied  cardiovascular symptoms, arrhythmia, chest pain/pressure, edema, exercise intolerance, orthopnea and palpitations.  Respiratory: Denied pulmonary symptoms, asthma, pleuritic pain, productive sputum, cough, dyspnea and wheezing.  Gastrointestinal: Denied, gastro-esophageal reflux, melena, nausea and vomiting.  Genitourinary: See HPI for additional information.  Musculoskeletal: Denied musculoskeletal symptoms, stiffness, swelling, muscle weakness and myalgia.  Dermatologic: Denied dermatology symptoms, rash and scar.  Neurologic: Denied neurology symptoms, dizziness, headache, neck pain and syncope.  Psychiatric: Denied psychiatric symptoms, anxiety and depression.  Endocrine: Denied endocrine symptoms including hot flashes and night sweats.   Meds:   Current Outpatient Medications on File Prior to Visit  Medication Sig Dispense Refill   albuterol (VENTOLIN HFA) 108 (90 Base) MCG/ACT inhaler Inhale 2 puffs into the lungs every 6 (six) hours as needed for wheezing or shortness of breath.     budesonide-formoterol (BREYNA) 80-4.5 MCG/ACT inhaler Inhale 2 puffs into the lungs 2 (two) times daily before a meal. 1 each 11   ibuprofen (ADVIL) 600 MG tablet Take 1 tablet (600 mg total) by mouth every 6 (six) hours as needed. 60 tablet 3   No current facility-administered medications on file prior to visit.      Objective:     Vitals:   10/01/22 1102  BP: 132/86  Pulse: 81   Filed Weights   10/01/22 1102  Weight: 156 lb 9.6 oz (71 kg)                        Assessment:    [id number] Patient Active Problem List   Diagnosis Date Noted   Post-operative state 07/01/2022   Moderate persistent  asthma without complication 26/83/4196   Iron deficiency anemia due to chronic blood loss 05/28/2022   Abuse, adult emotional 05/28/2022   Nodular goiter 05/28/2022   Fibroid uterus 02/2022     1. Dysuria   2. Vaginal discharge     Not sure why she is having dysuria at this point.  I would  expect that this would not be a factor any longer from surgery recovery.  Will rule out yeast and BV with Nuswab performed today.  But I doubt this is the source of her pelvic pain.   Plan:            1.  Nuswab performed  2.  Ultrasound ordered of the pelvis  3.  Ambulatory referral to urology Orders Orders Placed This Encounter  Procedures   US PELVIS (TRANSABDOMINAL ONLY)   US PELVIS TRANSVAGINAL NON-OB (TV ONLY)   Ambulatory referral to Urology   POCT urinalysis dipstick    No orders of the defined types were placed in this encounter.     F/U  No follow-ups on file. I spent 21 minutes involved in the care of this patient preparing to see the patient by obtaining and reviewing her medical history (including labs, imaging tests and prior procedures), documenting clinical information in the electronic health record (EHR), counseling and coordinating care plans, writing and sending prescriptions, ordering tests or procedures and in direct communicating with the patient and medical staff discussing pertinent items from her history and physical exam.  Finis Bud, M.D. 10/01/2022 11:41 AM

## 2022-10-01 NOTE — Progress Notes (Signed)
Patient presents today due to ongoing dysuria since surgery (07/01/22) and vaginal discharge. She states over the past 3 months she has been on Macrobid twice and Flagyl for BV. Urine dip is negative today. No additional concerns.

## 2022-10-02 LAB — CERVICOVAGINAL ANCILLARY ONLY
Bacterial Vaginitis (gardnerella): NEGATIVE
Candida Glabrata: NEGATIVE
Candida Vaginitis: POSITIVE — AB
Comment: NEGATIVE
Comment: NEGATIVE
Comment: NEGATIVE

## 2022-10-04 ENCOUNTER — Encounter: Payer: Self-pay | Admitting: Obstetrics

## 2022-10-04 ENCOUNTER — Other Ambulatory Visit: Payer: Self-pay | Admitting: Obstetrics

## 2022-10-04 MED ORDER — FLUCONAZOLE 150 MG PO TABS: 150.0000 mg | ORAL_TABLET | Freq: Once | ORAL | 1 refills | Status: AC

## 2022-10-16 ENCOUNTER — Other Ambulatory Visit: Payer: Self-pay

## 2022-10-16 DIAGNOSIS — R300 Dysuria: Secondary | ICD-10-CM

## 2022-10-16 NOTE — Progress Notes (Signed)
New order needed/placed for US Pelvic Complete w/Transvaginal due to no tech at [facility]. Patient being rescheduled to outpatient imaging.

## 2022-10-17 ENCOUNTER — Other Ambulatory Visit: Payer: Self-pay

## 2022-10-24 ENCOUNTER — Ambulatory Visit
Admission: RE | Admit: 2022-10-24 | Discharge: 2022-10-24 | Disposition: A | Discharge status: Home / Self Care | Payer: Self-pay | Source: Ambulatory Visit | Attending: Obstetrics and Gynecology | Admitting: Obstetrics and Gynecology

## 2022-10-24 DIAGNOSIS — R300 Dysuria: Secondary | ICD-10-CM | POA: Insufficient documentation

## 2022-10-24 NOTE — Progress Notes (Signed)
Just got your [place] results and it does not show any abnormalities.

## 2022-11-06 ENCOUNTER — Ambulatory Visit (INDEPENDENT_AMBULATORY_CARE_PROVIDER_SITE_OTHER): Payer: Self-pay | Admitting: Urology

## 2022-11-06 VITALS — BP 123/81 | HR 78 | Ht 65.0 in | Wt 160.5 lb

## 2022-11-06 DIAGNOSIS — R300 Dysuria: Secondary | ICD-10-CM

## 2022-11-06 DIAGNOSIS — N3289 Other specified disorders of bladder: Secondary | ICD-10-CM

## 2022-11-06 DIAGNOSIS — N2889 Other specified disorders of kidney and ureter: Secondary | ICD-10-CM

## 2022-11-06 DIAGNOSIS — R102 Pelvic and perineal pain: Secondary | ICD-10-CM

## 2022-11-06 LAB — BLADDER SCAN AMB NON-IMAGING: Scan Result: 0

## 2022-11-06 NOTE — Progress Notes (Signed)
I, Kirsten Brown,acting as a scribe for Kirsten Espy, MD.,have documented all relevant documentation on the behalf of Kirsten Espy, MD,as directed by  Kirsten Espy, MD while in the presence of Kirsten Espy, MD.   I, [name] as a scribe for Kirsten Espy, MD.,have documented all relevant documentation on the behalf of Kirsten Espy, MD,as directed by  Kirsten Espy, MD while in the presence of Kirsten Espy, MD.   11/06/22 6:43 PM   Kirsten Brown 09/04/1976 [id number]  Referring provider: Harlin Heys, [place] [employer],  [place] [address]  Chief Complaint  Patient presents with   Establish Care   Dysuria    HPI: 47 year-old female who presents today for further evaluation of dysuria.   She was last seen and evaluated by Dr. Amalia Hailey for near daily episodes of pain with urination. She also felt like she had trouble emptying, has to lean forward to complete it. She was recently status post abdominal hysterectomy with salpingectomy completed in 06/2022, secondary to anemia and uterine fibroids.  She has been treated on multiple occasions for urinary tract infections, although her urinalysis have been benign and her urine culture have been negative. She was also treated with one round of Flagyl for bacterial vaginosis in the setting of vaginal discharge. She did complete a transvaginal ultrasound on 10/24/22 which was unremarkable.   Reviewed CT abdomen/pelvis on 02/27/22 which revealed a 1.4 cm exophytic cyst off the lower pole, which was unable to be definitively characterized as a renal cyst.   Today's urinalysis is negative.   She complains of pain in the lower pelvis. The pain is most pronounced with the first urination in the morning, which she describes as being more concentrated. She denies frequency, urgency, incontinence, gross hematuria. She states that the pelvic pain began after taking Flagyl. She denies pain during sexual intercourse.  She feels the pain is somewhat improving. She does not notice anything that makes the pain worse.    Results for orders placed or performed in visit on 11/06/22  Bladder Scan (Post Void Residual) in office  Result Value Ref Range   Scan Result 0 ml       PMH: Past Medical History:  Diagnosis Date   Anemia    Asthma    Blood transfusion without reported diagnosis    Fibroid uterus 02/2022   Nodular goiter     Surgical History: Past Surgical History:  Procedure Laterality Date   ENDOMETRIAL BIOPSY  03/21/2022   Benign-done in office   HYSTERECTOMY ABDOMINAL WITH SALPINGECTOMY Bilateral 07/01/2022   Procedure: HYSTERECTOMY ABDOMINAL WITH SALPINGECTOMY;  Surgeon: Harlin Heys, MD;  Location: [facility] ORS;  Service: Gynecology;  Laterality: Bilateral;    Home Medications:  Allergies as of 11/06/2022       Reactions   Asa [aspirin] Shortness Of Breath        Medication List        Accurate as of November 06, 2022  6:43 PM. If you have any questions, ask your nurse or doctor.          STOP taking these medications    albuterol 108 (90 Base) MCG/ACT inhaler Commonly known as: VENTOLIN HFA   ibuprofen 600 MG tablet Commonly known as: ADVIL       TAKE these medications    budesonide-formoterol 80-4.5 MCG/ACT inhaler Commonly known as: Breyna Inhale 2 puffs into the lungs 2 (two) times daily before a meal.  Allergies:  Allergies  Allergen Reactions   Asa [Aspirin] Shortness Of Breath    Family History: Family History  Problem Relation Age of Onset   Heart disease Mother        pacemaker   Hypertension Mother    Hypertension Father    Diabetes Father    Other Sister        Recurrent skin lesion on chest   Cancer Brother        Unknown cancer in his leg for which he underwent successful surgery   Allergies Son        sinus issues    Social History:  reports that she has never smoked. She has never been exposed to tobacco smoke. She  has never used smokeless tobacco. She reports current alcohol use. She reports that she does not use drugs.  Pertinent Imaging: EXAM: TRANSABDOMINAL AND TRANSVAGINAL ULTRASOUND OF PELVIS   TECHNIQUE: Both transabdominal and transvaginal ultrasound examinations of the pelvis were performed. Transabdominal technique was performed for global imaging of the pelvis including uterus, ovaries, adnexal regions, and pelvic cul-de-sac. It was necessary to proceed with endovaginal exam following the transabdominal exam to visualize the ovaries.   COMPARISON:  None Available.   FINDINGS: Uterus   Surgically absent   Endometrium   Surgically absent   Right ovary   Measurements: 4.8 x 2.3 x 3.1 cm = volume: 17.5 mL. Normal morphology without mass   Left ovary   Measurements: 1.7 x 0.8 x 1.1 cm = volume: 0.8 mL. Normal morphology without mass   Other findings   No free pelvic fluid or adnexal masses.   IMPRESSION: Post hysterectomy with normal appearing ovaries.   No pelvic sonographic abnormalities identified.  Electronically Signed   By: Lavonia Dana M.D.   On: 10/24/2022 11:09   EXAM: CT ABDOMEN AND PELVIS WITH CONTRAST   TECHNIQUE: Multidetector CT imaging of the abdomen and pelvis was performed using the standard protocol following bolus administration of intravenous contrast.   RADIATION DOSE REDUCTION: This exam was performed according to the departmental dose-optimization program which includes automated exposure control, adjustment of the mA and/or kV according to patient size and/or use of iterative reconstruction technique.   CONTRAST:  128m OMNIPAQUE IOHEXOL 350 MG/ML SOLN   COMPARISON:  None.   FINDINGS: Lower chest: Minimal peribronchovascular nodularity may be postinfectious in etiology. Minimal dependent subpleural atelectasis. Heart size normal. No pericardial or pleural effusion.   Hepatobiliary: Liver and gallbladder are unremarkable. No  biliary ductal dilatation.   Pancreas: Negative.   Spleen: Negative.   Adrenals/Urinary Tract: Adrenal glands are unremarkable. 1.4 cm exophytic lesion off the lower pole right kidney measures 39 Hounsfield units. Kidneys are otherwise unremarkable. Ureters are decompressed. Bladder is grossly unremarkable.   Stomach/Bowel: Stomach, small bowel and colon are unremarkable. Appendix is not readily visualized.   Vascular/Lymphatic: Vascular structures are unremarkable. No pathologically enlarged lymph nodes.   Reproductive: Uterus is enlarged and contains heterogeneous masses measuring up to approximately 9.2 cm. No adnexal mass.   Other: Trace pelvic free fluid. Mesenteries and peritoneum are otherwise unremarkable.   Musculoskeletal: None.   IMPRESSION: 1. No acute findings. 2. Enlarged fibroid uterus. 3. 1.4 cm exophytic lesion off the lower pole right kidney cannot be characterized as a simple cyst. Further evaluation with nonemergent outpatient pre and post contrast MRI should be considered. Pre and post contrast CT could alternatively be performed, but would likely be of decreased accuracy given lesion size. 4. Small pelvic free  fluid.   Electronically Signed   By: Lorin Picket M.D.   On: 02/27/2022 16:28   Personally reviewed and agree with radiologic interpretation.   Physical Exam: BP 123/81   Pulse 78   Ht 5' 5"$  (1.651 m)   Wt 160 lb 8 oz (72.8 kg)   LMP 06/26/2022 (Exact Date)   BMI 26.71 kg/m   Constitutional:  Alert and oriented, No acute distress. HEENT: [place] AT, moist mucus membranes.  Trachea midline, no masses. Neurologic: Grossly intact, no focal deficits, moving all 4 extremities. Psychiatric: Normal mood and affect.  Assessment & Plan:    1. Right renal mass - Incidental on imaging on 6/23. Plan to repeat a renal ultrasound in four months to ensure stability and further characterize the mass. Escalate imaging as needed.   2. Pelvic  pain/bladder spasms - Her symptoms may be related to possible scar tissue or bladder spasms post-hysterectomy. Given that she is emptying her bladder well and the improvement in symptoms, no immediate intervention is planned. She is advised to monitor her symptoms and report any worsening. The possibility of bladder spasms was considered, and anticholinergic medication could be an option if symptoms worsen. We could also consider testing for mycoplasma, ureoplasma atypicals, although less suspected given the nature of her discomfort.  Return if symptoms worsen or fail to improve.  I have reviewed the above documentation for accuracy and completeness, and I agree with the above.   Kirsten Espy, MD    [facility] [address] [place], [place] [address] [phone]

## 2022-11-07 ENCOUNTER — Telehealth: Payer: Self-pay

## 2022-11-07 LAB — URINALYSIS, COMPLETE
Bilirubin, UA: NEGATIVE
Glucose, UA: NEGATIVE
Leukocytes,UA: NEGATIVE
Nitrite, UA: NEGATIVE
Protein,UA: NEGATIVE
RBC, UA: NEGATIVE
Specific Gravity, UA: 1.02 (ref 1.005–1.030)
Urobilinogen, Ur: 0.2 mg/dL (ref 0.2–1.0)
pH, UA: 7 (ref 5.0–7.5)

## 2022-11-07 LAB — MICROSCOPIC EXAMINATION

## 2022-11-07 NOTE — Telephone Encounter (Signed)
Not able to leave a message. Sent mychart message to patient to call me back to discuss

## 2022-11-07 NOTE — Telephone Encounter (Signed)
-----   Message from [name], MD sent at 11/07/2022  1:21 PM EST ----- Could you please let this patient know that back in June 2023 she had a CT scan that showed a slightly unusual cyst on her kidney which is pretty small.  It something I noticed whenever I was reviewing her chart and documenting our visit.  I think it warrants a follow-up renal ultrasound in about 4 months which would be a year from her initial scan to make sure that it is in fact just a simple cyst and not anything more concerning.  I went ahead and placed the order.  If you could let her know if she has any questions or concerns, I am happy to give her a call.  [name], MD

## 2022-11-08 NOTE — Telephone Encounter (Signed)
Patient advised. No questions at this time.

## 2022-11-11 ENCOUNTER — Ambulatory Visit: Payer: Self-pay

## 2022-11-12 ENCOUNTER — Ambulatory Visit
Admission: RE | Admit: 2022-11-12 | Discharge: 2022-11-12 | Disposition: A | Discharge status: Home / Self Care | Payer: Self-pay | Source: Ambulatory Visit | Attending: Urology | Admitting: Urology

## 2022-11-12 DIAGNOSIS — R102 Pelvic and perineal pain: Secondary | ICD-10-CM | POA: Insufficient documentation

## 2022-11-12 DIAGNOSIS — N3289 Other specified disorders of bladder: Secondary | ICD-10-CM | POA: Insufficient documentation

## 2022-11-12 DIAGNOSIS — N2889 Other specified disorders of kidney and ureter: Secondary | ICD-10-CM | POA: Insufficient documentation

## 2022-11-12 DIAGNOSIS — R300 Dysuria: Secondary | ICD-10-CM | POA: Insufficient documentation

## 2022-11-13 ENCOUNTER — Telehealth: Payer: Self-pay | Admitting: Urology

## 2022-11-13 NOTE — Telephone Encounter (Signed)
Pt LMOM asking about U/S results from yesterday.

## 2022-11-14 NOTE — Telephone Encounter (Signed)
Patient advised we need to schedule follow up to discuss. Appointment made

## 2022-11-21 ENCOUNTER — Encounter: Payer: Self-pay | Admitting: Urology

## 2022-11-21 ENCOUNTER — Ambulatory Visit (INDEPENDENT_AMBULATORY_CARE_PROVIDER_SITE_OTHER): Payer: Self-pay | Admitting: Urology

## 2022-11-21 VITALS — BP 134/96 | HR 80 | Ht 65.0 in | Wt 161.0 lb

## 2022-11-21 DIAGNOSIS — N281 Cyst of kidney, acquired: Secondary | ICD-10-CM

## 2022-11-21 DIAGNOSIS — N2889 Other specified disorders of kidney and ureter: Secondary | ICD-10-CM

## 2022-11-21 NOTE — Progress Notes (Signed)
Kirsten Brown,acting as a scribe for [name], MD.,have documented all relevant documentation on the behalf of [name], MD,as directed by  [name], MD while in the presence of [name], MD.  11/21/2022 9:52 AM   Kirsten Brown 1976/07/25 [id number]  Referring provider: Mack Hook, [place],  [name]  Chief Complaint  Patient presents with   discuss results    HPI: 47 year-old female who was seen and evaluated 2 weeks ago for lower pelvic pain and dysuria, but was also noted to have a right lower pole renal cyst on previous imaging dated 02/2022, which was never worked up.  She returns today with follow up renal ultrasound.  The imaging demonstrated a 1.5 cm by 1 cm by 1.2 cm hypoechoic mass in the lower pole of the right kidney with enhancement of the posterior wall. Per the radiologist, this was deemed to represent a probable complicated cyst. However, a solid mass couldn't be excluded and further characterization was recommended.  Today, she reports that her dysuria and pelvic pain has resolved and has no continuing symptoms.    PMH: Past Medical History:  Diagnosis Date   Anemia    Asthma    Blood transfusion without reported diagnosis    Fibroid uterus 02/2022   Nodular goiter     Surgical History: Past Surgical History:  Procedure Laterality Date   ENDOMETRIAL BIOPSY  03/21/2022   Benign-done in office   HYSTERECTOMY ABDOMINAL WITH SALPINGECTOMY Bilateral 07/01/2022   Procedure: HYSTERECTOMY ABDOMINAL WITH SALPINGECTOMY;  Surgeon: Harlin Heys, MD;  Location: [facility] ORS;  Service: Gynecology;  Laterality: Bilateral;    Home Medications:  Allergies as of 11/21/2022       Reactions   Asa [aspirin] Shortness Of Breath        Medication List        Accurate as of November 21, 2022  9:52 AM. If you have any questions, ask your nurse or doctor.          budesonide-formoterol 80-4.5 MCG/ACT  inhaler Commonly known as: Breyna Inhale 2 puffs into the lungs 2 (two) times daily before a meal.        Allergies:  Allergies  Allergen Reactions   Asa [Aspirin] Shortness Of Breath    Family History: Family History  Problem Relation Age of Onset   Heart disease Mother        pacemaker   Hypertension Mother    Hypertension Father    Diabetes Father    Other Sister        Recurrent skin lesion on chest   Cancer Brother        Unknown cancer in his leg for which he underwent successful surgery   Allergies Son        sinus issues    Social History:  reports that she has never smoked. She has never been exposed to tobacco smoke. She has never used smokeless tobacco. She reports current alcohol use. She reports that she does not use drugs.   Physical Exam: BP (!) 134/96   Pulse 80   Ht '5\' 5"'$  (1.651 m)   Wt 161 lb (73 kg)   LMP 06/26/2022 (Exact Date)   BMI 26.79 kg/m   Constitutional:  Alert and oriented, No acute distress. HEENT: [place] AT, moist mucus membranes.  Trachea midline, no masses. Neurologic: Grossly intact, no focal deficits, moving all 4 extremities. Psychiatric: Normal mood and affect.  Pertinent Imaging:  US RENAL  Narrative CLINICAL DATA:  Renal lesion identified on previous CT imaging.  EXAM: RENAL / URINARY TRACT ULTRASOUND COMPLETE  COMPARISON:  CT of the abdomen and pelvis February 27, 2022  FINDINGS: Right Kidney:  Renal measurements: 9.4 x 3.9 x 5.2 cm = volume: 100 mL. There is a 1.5 x 1 x 1.2 cm hypoechoic mass in the lower pole the right kidney with enhancement of the posterior wall, no posterior shadowing, and no identified internal blood flow. There is mild increased through transmission on 1 image. Increased cortical echogenicity.  Left Kidney:  Renal measurements: 10.9 x 5.2 x 5.1 cm = volume: 151 mL. Increased cortical echogenicity.  Bladder:  Appears normal for degree of bladder  distention.  Other:  None.  IMPRESSION: 1. The lesion in the lower pole of the right kidney is favored to represent a complicated cyst. However, a solid mass is not completely excluded. Recommend an MRI of the abdomen with and without contrast for definitive characterization. If MRI is not pursued, recommend a six-month follow-up ultrasound to ensure stability. 2. Increased cortical echogenicity in both kidneys consistent with medical renal disease.   Electronically Signed By: Dorise Bullion III M.D. On: 11/12/2022 19:40  This was personally reviewed and I agree with the radiologic interpretation.    Assessment & Plan:    1. 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 Given the relatively small size and modality of imaging, it is difficult to rule out solid mass and we will obtain more definitive imaging in 9 months. We will have a follow up at that time.  - MRI Abdomen with/without contrast in 9 months  - For cystic renal masses, we reviewed the Bosniak classification and discussed that Bosniak 3 lesions harbor a 50% chance of malignancy whereas Bosniak 4 cysts have a solid and 90-95% are malignant in nature.  - Given the possible complexity of the cyst, we agree that we should likely continue to follow this.   2. Pelvic pain/ dysuria - Completely resolved  Return in about 9 months (around 08/22/2023) for MRI abdomen .  [facility] [address], [place], [place] [address] [phone]

## 2022-12-31 ENCOUNTER — Telehealth: Payer: Self-pay

## 2022-12-31 ENCOUNTER — Ambulatory Visit: Payer: Self-pay | Admitting: Internal Medicine

## 2022-12-31 NOTE — Telephone Encounter (Signed)
Called patient to reschedule her appointment and gave her an appointment for 03/20/2023 . Patient would like to be seen sooner if there is any cancellations.

## 2023-01-04 ENCOUNTER — Encounter (HOSPITAL_BASED_OUTPATIENT_CLINIC_OR_DEPARTMENT_OTHER): Payer: Self-pay

## 2023-01-04 ENCOUNTER — Emergency Department (HOSPITAL_BASED_OUTPATIENT_CLINIC_OR_DEPARTMENT_OTHER): Payer: Self-pay

## 2023-01-04 ENCOUNTER — Emergency Department (HOSPITAL_BASED_OUTPATIENT_CLINIC_OR_DEPARTMENT_OTHER)
Admission: EM | Admit: 2023-01-04 | Discharge: 2023-01-04 | Disposition: A | Discharge status: Home / Self Care | Payer: Self-pay | Attending: Emergency Medicine | Admitting: Emergency Medicine

## 2023-01-04 DIAGNOSIS — R1084 Generalized abdominal pain: Secondary | ICD-10-CM | POA: Insufficient documentation

## 2023-01-04 DIAGNOSIS — K529 Noninfective gastroenteritis and colitis, unspecified: Secondary | ICD-10-CM | POA: Insufficient documentation

## 2023-01-04 LAB — CBC WITH DIFFERENTIAL/PLATELET
Abs Immature Granulocytes: 0.02 10*3/uL (ref 0.00–0.07)
Basophils Absolute: 0 10*3/uL (ref 0.0–0.1)
Basophils Relative: 0 %
Eosinophils Absolute: 0.5 10*3/uL (ref 0.0–0.5)
Eosinophils Relative: 5 %
HCT: 44 % (ref 36.0–46.0)
Hemoglobin: 15.1 g/dL — ABNORMAL HIGH (ref 12.0–15.0)
Immature Granulocytes: 0 %
Lymphocytes Relative: 18 %
Lymphs Abs: 1.7 10*3/uL (ref 0.7–4.0)
MCH: 30 pg (ref 26.0–34.0)
MCHC: 34.3 g/dL (ref 30.0–36.0)
MCV: 87.3 fL (ref 80.0–100.0)
Monocytes Absolute: 0.5 10*3/uL (ref 0.1–1.0)
Monocytes Relative: 5 %
Neutro Abs: 6.6 10*3/uL (ref 1.7–7.7)
Neutrophils Relative %: 72 %
Platelets: 247 10*3/uL (ref 150–400)
RBC: 5.04 MIL/uL (ref 3.87–5.11)
RDW: 12.4 % (ref 11.5–15.5)
WBC: 9.3 10*3/uL (ref 4.0–10.5)
nRBC: 0 % (ref 0.0–0.2)

## 2023-01-04 LAB — URINALYSIS, ROUTINE W REFLEX MICROSCOPIC
Bilirubin Urine: NEGATIVE
Glucose, UA: NEGATIVE mg/dL
Hgb urine dipstick: NEGATIVE
Ketones, ur: NEGATIVE mg/dL
Leukocytes,Ua: NEGATIVE
Nitrite: NEGATIVE
Protein, ur: NEGATIVE mg/dL
Specific Gravity, Urine: 1.005 (ref 1.005–1.030)
pH: 5 (ref 5.0–8.0)

## 2023-01-04 LAB — COMPREHENSIVE METABOLIC PANEL
ALT: 12 U/L (ref 0–44)
AST: 19 U/L (ref 15–41)
Albumin: 4.5 g/dL (ref 3.5–5.0)
Alkaline Phosphatase: 47 U/L (ref 38–126)
Anion gap: 6 (ref 5–15)
BUN: 13 mg/dL (ref 6–20)
CO2: 27 mmol/L (ref 22–32)
Calcium: 10.1 mg/dL (ref 8.9–10.3)
Chloride: 106 mmol/L (ref 98–111)
Creatinine, Ser: 0.7 mg/dL (ref 0.44–1.00)
GFR, Estimated: >60 mL/min (ref >60)
Glucose, Bld: 95 mg/dL (ref 70–99)
Potassium: 4.1 mmol/L (ref 3.5–5.1)
Sodium: 139 mmol/L (ref 135–145)
Total Bilirubin: 0.5 mg/dL (ref 0.3–1.2)
Total Protein: 7.5 g/dL (ref 6.5–8.1)

## 2023-01-04 LAB — LIPASE, BLOOD: Lipase: 22 U/L (ref 11–51)

## 2023-01-04 MED ORDER — DICYCLOMINE HCL 10 MG PO CAPS
10.0000 mg | ORAL_CAPSULE | Freq: Once | ORAL | Status: AC
  Administered 2023-01-04: 10 mg via ORAL
  Filled 2023-01-04: qty 1

## 2023-01-04 MED ORDER — ONDANSETRON HCL 4 MG/2ML IJ SOLN
4.0000 mg | Freq: Once | INTRAMUSCULAR | Status: AC
  Administered 2023-01-04: 4 mg via INTRAVENOUS
  Filled 2023-01-04: qty 2

## 2023-01-04 MED ORDER — DICYCLOMINE HCL 20 MG PO TABS: 20.0000 mg | ORAL_TABLET | Freq: Two times a day (BID) | ORAL | 0 refills | Status: DC

## 2023-01-04 MED ORDER — ONDANSETRON 8 MG PO TBDP: 8.0000 mg | ORAL_TABLET | Freq: Three times a day (TID) | ORAL | 0 refills | Status: DC | PRN

## 2023-01-04 MED ORDER — HYDROMORPHONE HCL 1 MG/ML IJ SOLN
1.0000 mg | Freq: Once | INTRAMUSCULAR | Status: AC
  Administered 2023-01-04: 1 mg via INTRAVENOUS
  Filled 2023-01-04: qty 1

## 2023-01-04 MED ORDER — IOHEXOL 300 MG/ML  SOLN
100.0000 mL | Freq: Once | INTRAMUSCULAR | Status: AC | PRN
  Administered 2023-01-04: 80 mL via INTRAVENOUS

## 2023-01-04 NOTE — ED Triage Notes (Signed)
She c/o "bad pain" in her entire abdomen. She is tearful and in no distress.

## 2023-01-04 NOTE — Discharge Instructions (Addendum)
Received in the emergency department due to abdominal pain.  As we discussed your CT scan was notable for findings consistent with enteritis is typically inflammation throughout the colon.  This is typically an acute process that will resolve without any intervention, take the Bentyl every 12 hours.  Drink plenty of fluids, bland food.  You can take the Zofran every 8 hours as needed for nausea.  Return to the ED if the pain comes available, urinary lesions without vomiting, new or concerning symptoms and otherwise follow-up with your primary next week.  As we discussed also the mass was found on your right kidney, continue following up with urology for that.

## 2023-01-04 NOTE — ED Provider Notes (Cosign Needed Addendum)
[place] EMERGENCY DEPARTMENT AT [facility] Provider Note   CSN: [id number] Arrival date & time: 01/04/23  9528     History  Chief Complaint  Patient presents with   Abdominal Pain    Kirsten Brown is a 47 y.o. female.   Abdominal Pain    This is a 47 year old female with medical history of hysterectomy with bilateral salpingectomy presenting to the emergency department due to diffuse abdominal pain.  It started last night, feels like a burning sensation all across the abdomen.  It is intermittent, unable to identify any provoking or alleviating factors.  Denies any nausea, vomiting, chest pain, shortness of breath, dysuria, hematuria, flank pain, radiation.  No medicine prior to arrival.  Home Medications Prior to Admission medications   Medication Sig Start Date End Date Taking? Authorizing Provider  dicyclomine (BENTYL) 20 MG tablet Take 1 tablet (20 mg total) by mouth 2 (two) times daily. 01/04/23  Yes Theron Arista, PA-C  ondansetron (ZOFRAN-ODT) 8 MG disintegrating tablet Take 1 tablet (8 mg total) by mouth every 8 (eight) hours as needed for nausea or vomiting. 01/04/23  Yes Theron Arista, PA-C  budesonide-formoterol (BREYNA) 80-4.5 MCG/ACT inhaler Inhale 2 puffs into the lungs 2 (two) times daily before a meal. 08/29/22   Julieanne Manson, MD      Allergies    Asa [aspirin]    Review of Systems   Review of Systems  Gastrointestinal:  Positive for abdominal pain.    Physical Exam Updated Vital Signs BP 113/75   Pulse (!) 50   Temp 98.2 F (36.8 C) (Oral)   Resp 16   LMP 06/26/2022 (Exact Date)   SpO2 100%  Physical Exam Vitals and nursing note reviewed. Exam conducted with a chaperone present.  Constitutional:      Appearance: Normal appearance.     Comments: Tearful affect   HENT:     Head: Normocephalic and atraumatic.  Eyes:     General: No scleral icterus.       Right eye: No discharge.        Left eye: No discharge.     Extraocular  Movements: Extraocular movements intact.     Pupils: Pupils are equal, round, and reactive to light.  Cardiovascular:     Rate and Rhythm: Normal rate and regular rhythm.     Pulses: Normal pulses.     Heart sounds: Normal heart sounds. No murmur heard.    No friction rub. No gallop.  Pulmonary:     Effort: Pulmonary effort is normal. No respiratory distress.     Breath sounds: Normal breath sounds.  Abdominal:     General: Abdomen is flat. Bowel sounds are normal. There is no distension.     Palpations: Abdomen is soft.     Tenderness: There is generalized abdominal tenderness.  Skin:    General: Skin is warm and dry.     Coloration: Skin is not jaundiced.  Neurological:     Mental Status: She is alert. Mental status is at baseline.     Coordination: Coordination normal.     ED Results / Procedures / Treatments   Labs (all labs ordered are listed, but only abnormal results are displayed) Labs Reviewed  CBC WITH DIFFERENTIAL/PLATELET - Abnormal; Notable for the following components:      Result Value   Hemoglobin 15.1 (*)    All other components within normal limits  URINALYSIS, ROUTINE W REFLEX MICROSCOPIC - Abnormal; Notable for the following components:  Color, Urine COLORLESS (*)    All other components within normal limits  COMPREHENSIVE METABOLIC PANEL  LIPASE, BLOOD    EKG None  Radiology CT ABDOMEN PELVIS W CONTRAST  Result Date: 01/04/2023 CLINICAL DATA:  Generalized abdominal pain. EXAM: CT ABDOMEN AND PELVIS WITH CONTRAST TECHNIQUE: Multidetector CT imaging of the abdomen and pelvis was performed using the standard protocol following bolus administration of intravenous contrast. RADIATION DOSE REDUCTION: This exam was performed according to the departmental dose-optimization program which includes automated exposure control, adjustment of the mA and/or kV according to patient size and/or use of iterative reconstruction technique. CONTRAST:  80mL OMNIPAQUE  IOHEXOL 300 MG/ML  SOLN COMPARISON:  CT abdomen dated 02/27/2022. FINDINGS: Lower chest: No acute abnormality. Hepatobiliary: No focal liver abnormality is seen. Gallbladder appears normal. No bile duct dilatation. Pancreas: Unremarkable. No pancreatic ductal dilatation or surrounding inflammatory changes. Spleen: Normal in size without focal abnormality. Adrenals/Urinary Tract: Adrenal glands appear normal. Hypoenhancing mass is present at the lower pole of the RIGHT kidney, exophytic, measuring 1.1 cm, with a Hounsfield unit measurement of 41. Kidneys appear otherwise unremarkable without stone or hydronephrosis. No ureteral or bladder calculi are identified. Bladder appears normal. Stomach/Bowel: No dilated large or small bowel loops. There is thickening of the walls of the small bowel in the central abdomen and upper pelvis. Stomach is unremarkable, although partially decompressed limiting characterization. Appendix is normal. Vascular/Lymphatic: No abdominal aortic aneurysm. No acute-appearing vascular abnormality. No enlarged lymph nodes are seen in the abdomen or pelvis. Reproductive: Status post hysterectomy.  No adnexal mass. Other: Small amount of free fluid in the pelvis, possibly physiologic in nature or related to the presumed small-bowel enteritis. No free fluid within the upper abdomen. No abscess collection is seen. w no free intraperitoneal air. Musculoskeletal: No acute or significant osseous findings. IMPRESSION: 1. Thickening of the walls of the small bowel in the central abdomen and upper pelvis, consistent with an enteritis of infectious or inflammatory nature. No bowel obstruction. 2. Small amount of free fluid in the pelvis, possibly physiologic in nature or related to the presumed small-bowel enteritis. No abscess collection. No free intraperitoneal air. 3. **An incidental finding of potential clinical significance has been found. Indeterminate hypoenhancing mass exophytic to the lower pole  of the RIGHT kidney, measuring 1.1 cm. This may represent a benign hemorrhagic or proteinaceous cyst, however, neoplastic mass can not be entirely excluded. Recommend nonemergent renal MRI with and without contrast to exclude malignancy.** 4. Remainder of the abdomen and pelvis CT is unremarkable. No renal or ureteral calculi. Appendix is normal. Electronically Signed   By: Bary Richard M.D.   On: 01/04/2023 11:03    Procedures Procedures    Medications Ordered in ED Medications  dicyclomine (BENTYL) capsule 10 mg (has no administration in time range)  HYDROmorphone (DILAUDID) injection 1 mg (1 mg Intravenous Given 01/04/23 0927)  ondansetron (ZOFRAN) injection 4 mg (4 mg Intravenous Given 01/04/23 0924)  iohexol (OMNIPAQUE) 300 MG/ML solution 100 mL (80 mLs Intravenous Contrast Given 01/04/23 1025)    ED Course/ Medical Decision Making/ A&P Clinical Course as of 01/04/23 1113  Sat Jan 04, 2023  1020 I have reevaluated the patient, pain is improved with the Dilaudid although not fully resolved.  Updated patient on laboratory workup, will reevaluate again after CT abdomen. [HS]    Clinical Course User Index [HS] Theron Arista, PA-C  Medical Decision Making Amount and/or Complexity of Data Reviewed Labs: ordered. Radiology: ordered.  Risk Prescription drug management.   Patient is a 47 year old female presenting to the emergency department due to abdominal pain.  Differential is broad and includes diverticulitis, appendicitis, bowel obstruction, cholecystitis, gastritis, UTI, pyelonephritis, nephrolithiasis, pancreatitis.   I viewed external medical records including surgical history, she is status post hysterectomy with bilateral salpingectomy.  Lower suspicion for GU process.  Additionally she is not having any chest pain or shortness of breath, no pain above the diaphragm.  Lower suspicion for cardiovascular etiology.  She is not having any rectal bleeding,  no risk factors for mesenteric ischemia.  No SIRS criteria met, not consistent with sepsis.  Will start with labs, CT abdomen given diffuse pain and tearfulness.  Pain medicine ordered as well as antiemetic.  I ordered, viewed and interpreted laboratory workup. CBC without leukocytosis or anemia  CMP without gross electrolyte ederragnement, AKI or transaminates  Lipase within normal limits, not consistent with pancreatitis  UA negative, not consistent with UTI. No hematuria or flank pain, doubt nephrolithiasis.  CT abdomen pelvis w/ contrast notable for enteritis.  Also an enhancing mass in the right kidney, patient is aware of this and follows with urology.  Updated patient on the CT results, I printed a copy of the report for her reference.  Will discharge with Bentyl, antiemetics.  Patient stable for outpatient follow the time for enteritis.         Final Clinical Impression(s) / ED Diagnoses Final diagnoses:  Generalized abdominal pain  Enteritis    Rx / DC Orders ED Discharge Orders          Ordered    dicyclomine (BENTYL) 20 MG tablet  2 times daily        01/04/23 1111    ondansetron (ZOFRAN-ODT) 8 MG disintegrating tablet  Every 8 hours PRN        01/04/23 1111                Theron Arista, PA-C 01/04/23 1114    Mardene Sayer, MD 01/05/23 1755

## 2023-03-20 ENCOUNTER — Ambulatory Visit: Payer: Self-pay | Admitting: Internal Medicine

## 2023-03-21 ENCOUNTER — Ambulatory Visit: Payer: Self-pay | Admitting: Internal Medicine

## 2023-03-21 VITALS — BP 118/80 | HR 72 | Resp 16 | Ht 65.0 in | Wt 165.0 lb

## 2023-03-21 DIAGNOSIS — M7061 Trochanteric bursitis, right hip: Secondary | ICD-10-CM

## 2023-03-21 DIAGNOSIS — N2889 Other specified disorders of kidney and ureter: Secondary | ICD-10-CM

## 2023-03-21 DIAGNOSIS — M79651 Pain in right thigh: Secondary | ICD-10-CM

## 2023-03-21 DIAGNOSIS — R102 Pelvic and perineal pain: Secondary | ICD-10-CM

## 2023-03-21 DIAGNOSIS — T148XXA Other injury of unspecified body region, initial encounter: Secondary | ICD-10-CM

## 2023-03-21 DIAGNOSIS — G44209 Tension-type headache, unspecified, not intractable: Secondary | ICD-10-CM

## 2023-03-21 DIAGNOSIS — Z23 Encounter for immunization: Secondary | ICD-10-CM

## 2023-03-21 NOTE — Progress Notes (Signed)
Subjective:    Patient ID: Kirsten Brown, female   DOB: [birth year], 47 y.o.   MRN: [id number]   HPI   Bruising on inner thighs.  Shows photos of one bruise on inner thigh she obtained in May and the other side apparently in April.  She does not recall doing anything to cause them.  She still has residual color from the bruising on the bilateral inner thighs.   She does state they were painful at the time she first noted.  She has not noted bruising elsewhere and does not have bleeding from gums or nose.  No significant bleeding from cuts that are prolonged.    2.  Headaches:  Affect different areas of head.  Points to right temporal-parietal area, but can have on left or top.  Has had going down bilateral neck.  She states it feels like something is moving around in her head and her head is sore wherever the movement is.   No aura. No photophobia, but perhaps phonophobia.  Nausea at times, but no vomiting.   She describes the pain as pressure.  Her head feels heavy.   Having headaches once to twice monthly.  Can last a few hours to a whole daily.   Has had these headaches since 2015.  No history of head injury.   No family history of headaches/migraines  Denies increased stress. 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   States her mattress is about 2 months old,  Her pillows are older.  .   Takes 1000 mg of Tylenol 2 to 3 times daily.  She has an ASA allergy  Sleeps on her side. She has a smaller pillow to put under her neck but denies neck pain if she does not use the smaller pillow.   Current Meds  Medication Sig   budesonide-formoterol (BREYNA) 80-4.5 MCG/ACT inhaler Inhale 2 puffs into the lungs 2 (two) times daily before a meal.   dicyclomine (BENTYL) 20 MG tablet Take 1 tablet (20 mg total) by mouth 2 (two) times daily.   Allergies  Allergen Reactions   Asa [Aspirin] Shortness Of Breath      Review of Systems    Objective:   BP 118/80 (BP Location: Right Arm, Patient Position: Sitting, Cuff Size: Normal)   Pulse 72   Resp 16   Ht 5\' 5"  (1.651 m)   Wt 165 lb (74.8 kg)   LMP 06/26/2022 (Exact Date)   BMI 27.46 kg/m   Physical Exam HEENT:  PERRL, EOMI, TMs pearly gray, throat without injection Neck:  Supple, No adenopathy Chest:  CTA CV:  RRR without murmur or rub.  Radial and DP pulses normal and equal Abd:  S, NT, No HSM or mass, + BS Skin:  no petichiae, bruising MS:  Tender over bilateral tight traps to nuchal ridge on palpation of paraspinous cervical musculature.  Tender over right greater trochanter and gluteal musculature bilaterally.  Discomfort with crossing right ankle over left knee    Assessment & Plan  Concern for bruising:  no findings today.  CBC normal in April.  To call if bruising recurs.    2.  Tension Headaches:  went over warm packing and stretches for neck and upper back nightly.  Referral to [facility].    3.  Pelvic girdle and right greater trochanter pain:  referral to [facility]  pro bono PT for this as well.    4.  HM:  Td today.  Pneumococcal 20 at next visit. Needs TB testing --checking on cost.  5.  Right renal mass:  found on CT in April when in ED for enteritis/abdominal pain.  Being followed now by Urology, [facility].

## 2023-03-21 NOTE — Patient Instructions (Signed)
Stretches after warm bath or applying heating pad every night. For 20 minutes  [facility]:  [phone]

## 2023-06-09 DIAGNOSIS — Z5941 Food insecurity: Secondary | ICD-10-CM | POA: Insufficient documentation

## 2023-06-09 DIAGNOSIS — Z59869 Financial insecurity, unspecified: Secondary | ICD-10-CM | POA: Insufficient documentation

## 2023-06-09 DIAGNOSIS — Z5986 Financial insecurity: Secondary | ICD-10-CM | POA: Insufficient documentation

## 2023-06-12 ENCOUNTER — Telehealth: Payer: Self-pay

## 2023-06-12 NOTE — Telephone Encounter (Signed)
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 Test results can be seen on Care Everywhere tab. Patient would like to know if she needs follow up for treatment.

## 2023-06-13 NOTE — Telephone Encounter (Signed)
Patient reported that she already scheduled herself for imaging and does not need referral to [facility]

## 2023-06-13 NOTE — Telephone Encounter (Signed)
Send to [facility]

## 2023-06-24 ENCOUNTER — Ambulatory Visit
Admission: RE | Admit: 2023-06-24 | Discharge: 2023-06-24 | Disposition: A | Discharge status: Home / Self Care | Payer: Self-pay | Source: Ambulatory Visit | Attending: Urology | Admitting: Urology

## 2023-06-24 DIAGNOSIS — N2889 Other specified disorders of kidney and ureter: Secondary | ICD-10-CM | POA: Insufficient documentation

## 2023-06-24 MED ORDER — GADOBUTROL 1 MMOL/ML IV SOLN
7.0000 mL | Freq: Once | INTRAVENOUS | Status: AC | PRN
  Administered 2023-06-24: 7 mL via INTRAVENOUS

## 2023-06-30 ENCOUNTER — Other Ambulatory Visit: Payer: Self-pay | Admitting: Physician Assistant

## 2023-06-30 ENCOUNTER — Ambulatory Visit
Admission: RE | Admit: 2023-06-30 | Discharge: 2023-06-30 | Disposition: A | Discharge status: Home / Self Care | Payer: No Typology Code available for payment source | Source: Ambulatory Visit | Attending: Physician Assistant | Admitting: Physician Assistant

## 2023-06-30 DIAGNOSIS — R7611 Nonspecific reaction to tuberculin skin test without active tuberculosis: Secondary | ICD-10-CM

## 2023-07-14 ENCOUNTER — Telehealth: Payer: Self-pay | Admitting: Internal Medicine

## 2023-07-14 NOTE — Telephone Encounter (Signed)
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 Delrae Alfred and ask if she needed to do it.  Discussed with Dr. Delrae Alfred- Patient needs to go get the treatment. 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 Patient verbalized understanding and will [facility] out to the [facility] to get the treatment.

## 2023-07-16 ENCOUNTER — Other Ambulatory Visit: Payer: Self-pay

## 2023-07-16 ENCOUNTER — Emergency Department (HOSPITAL_COMMUNITY)
Admission: EM | Admit: 2023-07-16 | Discharge: 2023-07-16 | Disposition: A | Discharge status: Home / Self Care | Payer: No Typology Code available for payment source | Attending: Emergency Medicine | Admitting: Emergency Medicine

## 2023-07-16 ENCOUNTER — Encounter (HOSPITAL_COMMUNITY): Payer: Self-pay

## 2023-07-16 ENCOUNTER — Emergency Department (HOSPITAL_COMMUNITY): Payer: No Typology Code available for payment source

## 2023-07-16 DIAGNOSIS — G43809 Other migraine, not intractable, without status migrainosus: Secondary | ICD-10-CM | POA: Insufficient documentation

## 2023-07-16 LAB — CBC WITH DIFFERENTIAL/PLATELET
Abs Immature Granulocytes: 0.01 10*3/uL (ref 0.00–0.07)
Basophils Absolute: 0 10*3/uL (ref 0.0–0.1)
Basophils Relative: 1 %
Eosinophils Absolute: 0.5 10*3/uL (ref 0.0–0.5)
Eosinophils Relative: 10 %
HCT: 42.4 % (ref 36.0–46.0)
Hemoglobin: 14.4 g/dL (ref 12.0–15.0)
Immature Granulocytes: 0 %
Lymphocytes Relative: 40 %
Lymphs Abs: 1.9 10*3/uL (ref 0.7–4.0)
MCH: 29.4 pg (ref 26.0–34.0)
MCHC: 34 g/dL (ref 30.0–36.0)
MCV: 86.5 fL (ref 80.0–100.0)
Monocytes Absolute: 0.3 10*3/uL (ref 0.1–1.0)
Monocytes Relative: 7 %
Neutro Abs: 2.1 10*3/uL (ref 1.7–7.7)
Neutrophils Relative %: 42 %
Platelets: 230 10*3/uL (ref 150–400)
RBC: 4.9 MIL/uL (ref 3.87–5.11)
RDW: 12.9 % (ref 11.5–15.5)
WBC: 4.9 10*3/uL (ref 4.0–10.5)
nRBC: 0 % (ref 0.0–0.2)

## 2023-07-16 LAB — COMPREHENSIVE METABOLIC PANEL
ALT: 22 U/L (ref 0–44)
AST: 26 U/L (ref 15–41)
Albumin: 4.1 g/dL (ref 3.5–5.0)
Alkaline Phosphatase: 42 U/L (ref 38–126)
Anion gap: 8 (ref 5–15)
BUN: <5 mg/dL — ABNORMAL LOW (ref 6–20)
CO2: 24 mmol/L (ref 22–32)
Calcium: 9.4 mg/dL (ref 8.9–10.3)
Chloride: 107 mmol/L (ref 98–111)
Creatinine, Ser: 0.6 mg/dL (ref 0.44–1.00)
GFR, Estimated: >60 mL/min (ref >60)
Glucose, Bld: 100 mg/dL — ABNORMAL HIGH (ref 70–99)
Potassium: 3.7 mmol/L (ref 3.5–5.1)
Sodium: 139 mmol/L (ref 135–145)
Total Bilirubin: 0.8 mg/dL (ref 0.3–1.2)
Total Protein: 7.1 g/dL (ref 6.5–8.1)

## 2023-07-16 LAB — HIV ANTIBODY (ROUTINE TESTING W REFLEX): HIV Screen 4th Generation wRfx: NONREACTIVE

## 2023-07-16 LAB — CRYPTOCOCCAL ANTIGEN: Crypto Ag: NEGATIVE

## 2023-07-16 MED ORDER — DIPHENHYDRAMINE HCL 50 MG/ML IJ SOLN
12.5000 mg | Freq: Once | INTRAMUSCULAR | Status: AC
  Administered 2023-07-16: 12.5 mg via INTRAVENOUS
  Filled 2023-07-16: qty 1

## 2023-07-16 MED ORDER — METOCLOPRAMIDE HCL 5 MG/ML IJ SOLN
10.0000 mg | Freq: Once | INTRAMUSCULAR | Status: AC
  Administered 2023-07-16: 10 mg via INTRAVENOUS
  Filled 2023-07-16: qty 2

## 2023-07-16 MED ORDER — DEXAMETHASONE SODIUM PHOSPHATE 10 MG/ML IJ SOLN
10.0000 mg | Freq: Once | INTRAMUSCULAR | Status: AC
Start: 2023-07-16 — End: 2023-07-16
  Administered 2023-07-16: 10 mg via INTRAVENOUS
  Filled 2023-07-16: qty 1

## 2023-07-16 NOTE — ED Provider Notes (Signed)
[place] EMERGENCY DEPARTMENT AT [facility] Provider Note   CSN: [id number] Arrival date & time: 07/16/23  0102     History  Chief Complaint  Patient presents with   Headache    Kirsten Brown is a 47 y.o. female.  47 year old female with history of recurrent headaches presents with left-sided headache which began yesterday.  States that she has not had any nausea or vomiting.  States that feels like the something moving in her head from left to right.  No fever.  No photophobia or neck pain.  Denies any visual changes.  Denies any focal neurological weakness.  Review of her old chart shows that she did have a positive blood test for TB.  She states that she had a negative chest x-ray however.  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  He denies any hemoptysis.  No night sweats noted.       Home Medications Prior to Admission medications   Medication Sig Start Date End Date Taking? Authorizing Provider  budesonide-formoterol (BREYNA) 80-4.5 MCG/ACT inhaler Inhale 2 puffs into the lungs 2 (two) times daily before a meal. 08/29/22   Julieanne Manson, MD  dicyclomine (BENTYL) 20 MG tablet Take 1 tablet (20 mg total) by mouth 2 (two) times daily. 01/04/23   Theron Arista, PA-C  ondansetron (ZOFRAN-ODT) 8 MG disintegrating tablet Take 1 tablet (8 mg total) by mouth every 8 (eight) hours as needed for nausea or vomiting. Patient not taking: Reported on 03/21/2023 01/04/23   Theron Arista, PA-C      Allergies    Jonne Ply [aspirin]    Review of Systems   Review of Systems  All other systems reviewed and are negative.   Physical Exam Updated Vital Signs BP (!) 125/94 (BP Location: Left Leg)   Pulse 75   Temp 97.9 F (36.6 C)   Resp 18   Ht 1.651 m (5\' 5" )   Wt 74.8 kg   LMP 06/26/2022 (Exact Date)   SpO2 100%   BMI 27.46 kg/m  Physical Exam Vitals and nursing note reviewed.  Constitutional:      General: She is not in acute distress.    Appearance:  Normal appearance. She is well-developed. She is not toxic-appearing.  HENT:     Head: Normocephalic and atraumatic.  Eyes:     General: Lids are normal.     Conjunctiva/sclera: Conjunctivae normal.     Pupils: Pupils are equal, round, and reactive to light.  Neck:     Thyroid: No thyroid mass.     Trachea: No tracheal deviation.  Cardiovascular:     Rate and Rhythm: Normal rate and regular rhythm.     Heart sounds: Normal heart sounds. No murmur heard.    No gallop.  Pulmonary:     Effort: Pulmonary effort is normal. No respiratory distress.     Breath sounds: Normal breath sounds. No stridor. No decreased breath sounds, wheezing, rhonchi or rales.  Abdominal:     General: There is no distension.     Palpations: Abdomen is soft.     Tenderness: There is no abdominal tenderness. There is no rebound.  Musculoskeletal:        General: No tenderness. Normal range of motion.     Cervical back: Normal range of motion and neck supple.  Skin:    General: Skin is warm and dry.     Findings: No abrasion or rash.  Neurological:     General: No focal  deficit present.     Mental Status: She is alert and oriented to person, place, and time. Mental status is at baseline.     GCS: GCS eye subscore is 4. GCS verbal subscore is 5. GCS motor subscore is 6.     Cranial Nerves: Cranial nerves are intact. No cranial nerve deficit.     Sensory: No sensory deficit.     Motor: Motor function is intact.  Psychiatric:        Attention and Perception: Attention normal.        Speech: Speech normal.        Behavior: Behavior normal.     ED Results / Procedures / Treatments   Labs (all labs ordered are listed, but only abnormal results are displayed) Labs Reviewed  CBC WITH DIFFERENTIAL/PLATELET  COMPREHENSIVE METABOLIC PANEL    EKG None  Radiology No results found.  Procedures Procedures    Medications Ordered in ED Medications  metoCLOPramide (REGLAN) injection 10 mg (has no  administration in time range)  diphenhydrAMINE (BENADRYL) injection 12.5 mg (has no administration in time range)    ED Course/ Medical Decision Making/ A&P                                 Medical Decision Making Amount and/or Complexity of Data Reviewed Labs: ordered. Radiology: ordered.  Risk Prescription drug management.   Patient here with headache times several days.  Patient given migraine cocktail and feels better.  Head CT without acute findings here.  Upon further discussion, patient states that she has had these headaches for quite some time.  Has been seen by her doctor for same.  She sees Dr. Delrae Alfred.  Was told that these are tension related and she is not on medication at this time for them.  I did discuss her case with Dr. 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  She recommended that I add a HIV as well as cryptococcal antigen.  1:34 PM Patient's HIV test is negative.  Her headache is still improved.  Low suspicion for subarachnoid or meningitis at this time.  Do not feel that she needs to have a lumbar puncture.  Will discharge home        Final Clinical Impression(s) / ED Diagnoses Final diagnoses:  None    Rx / DC Orders ED Discharge Orders     None         Lorre Nick, MD 07/16/23 1334

## 2023-07-16 NOTE — ED Triage Notes (Addendum)
Pt. Stated, Kirsten Brown had a headache sine yesterday morning.It feels like something moving in my head and its burning. Denies N/V  Pt. Holding her head and face.

## 2023-07-23 ENCOUNTER — Telehealth: Payer: Self-pay

## 2023-07-23 ENCOUNTER — Ambulatory Visit: Payer: Self-pay | Admitting: Internal Medicine

## 2023-07-23 NOTE — Telephone Encounter (Signed)
Patient missed her appointment and would like to be on wait list to reschedule appointment .  We will call patient if there is a cancellation.

## 2023-08-05 ENCOUNTER — Ambulatory Visit (INDEPENDENT_AMBULATORY_CARE_PROVIDER_SITE_OTHER): Payer: Self-pay | Admitting: Urology

## 2023-08-05 VITALS — BP 136/85 | HR 74 | Ht 68.0 in | Wt 157.0 lb

## 2023-08-05 DIAGNOSIS — N2889 Other specified disorders of kidney and ureter: Secondary | ICD-10-CM

## 2023-08-05 DIAGNOSIS — N281 Cyst of kidney, acquired: Secondary | ICD-10-CM

## 2023-08-05 NOTE — Progress Notes (Signed)
I,Amy L Pierron,acting as a scribe for Vanna Scotland, MD.,have documented all relevant documentation on the behalf of Vanna Scotland, MD,as directed by  Vanna Scotland, MD while in the presence of Vanna Scotland, MD.  08/05/2023 2:43 PM   Kirsten Brown 1976-09-05 [id number]  Referring provider: Julieanne Manson, MD [address] [place],  [place] [address]  Chief Complaint  Patient presents with   Other    HPI: 47 year-old female presents today for a follow-up of an incidental renal mass with MRI.  She was initially seen and evaluated in January of 2024. This was initially described as a 1.5 by 1.2 centimeter right lower pole cystic renal lesion, thought to be a probable complicated cyst. It's suspected to be Bosniak 2 or 2F.  MRI with and without contrast completed on 06/24/2023 was unfortunately motion degraded. However, the lesion in question measured 1.4 centimeters by 1.5, similar size, with a thin, hyper-intense septation, likely Bosniak 2.   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   She is doing well overall with no new symptoms or complaints.    PMH: Past Medical History:  Diagnosis Date   Anemia    Asthma    Blood transfusion without reported diagnosis    Fibroid uterus 02/2022   Nodular goiter     Surgical History: Past Surgical History:  Procedure Laterality Date   ENDOMETRIAL BIOPSY  03/21/2022   Benign-done in office   HYSTERECTOMY ABDOMINAL WITH SALPINGECTOMY Bilateral 07/01/2022   Procedure: HYSTERECTOMY ABDOMINAL WITH SALPINGECTOMY;  Surgeon: Linzie Collin, MD;  Location: [facility] ORS;  Service: Gynecology;  Laterality: Bilateral;    Home Medications:  Allergies as of 08/05/2023       Reactions   Asa [aspirin] Shortness Of Breath        Medication List        Accurate as of August 05, 2023  2:43 PM. If you have any questions, ask your nurse or doctor.          STOP taking these medications    dicyclomine 20 MG  tablet Commonly known as: BENTYL Stopped by: Vanna Scotland   ondansetron 8 MG disintegrating tablet Commonly known as: ZOFRAN-ODT Stopped by: Vanna Scotland       TAKE these medications    budesonide-formoterol 80-4.5 MCG/ACT inhaler Commonly known as: Breyna Inhale 2 puffs into the lungs 2 (two) times daily before a meal.        Allergies:  Allergies  Allergen Reactions   Asa [Aspirin] Shortness Of Breath    Family History: Family History  Problem Relation Age of Onset   Heart disease Mother        pacemaker   Hypertension Mother    Hypertension Father    Diabetes Father    Other Sister        Recurrent skin lesion on chest   Cancer Brother        Unknown cancer in his leg for which he underwent successful surgery   Allergies Son        sinus issues    Social History:  reports that she has never smoked. She has never been exposed to tobacco smoke. She has never used smokeless tobacco. She reports current alcohol use. She reports that she does not use drugs.   Physical Exam: BP 136/85   Pulse 74   Ht 5\' 8"  (1.727 m)   Wt 157 lb (71.2 kg)   LMP 06/26/2022 (Exact Date)   BMI 23.87 kg/m  Constitutional:  Alert and oriented, No acute distress. HEENT: [place] AT, moist mucus membranes.  Trachea midline, no masses. Neurologic: Grossly intact, no focal deficits, moving all 4 extremities. Psychiatric: Normal mood and affect.   Pertinent Imaging: CLINICAL DATA:  Lower pole right renal mass CT. Patient asymptomatic.   EXAM: MRI ABDOMEN WITHOUT AND WITH CONTRAST   TECHNIQUE: Multiplanar multisequence MR imaging of the abdomen was performed both before and after the administration of intravenous contrast.   CONTRAST:  7mL GADAVIST GADOBUTROL 1 MMOL/ML IV SOLN   COMPARISON:  CT 01/04/2023   FINDINGS: Mild motion degradation involves the in and out of phase T1 weighted images. Mild to moderate motion degradation involves the pre and postcontrast dynamic T1  weighted imaging.   Lower chest: Normal heart size without pericardial or pleural effusion.   Hepatobiliary: Normal liver. Normal gallbladder, without biliary ductal dilatation.   Pancreas:  Normal, without mass or ductal dilatation.   Spleen:  Normal in size, without focal abnormality.   Adrenals/Urinary Tract: Normal adrenal glands. Corresponding to the CT abnormality, exophytic off the anterior lower pole right kidney, is a 12 mm lesion on image 24/11 which is similar in size back to 02/27/2022. Measures maximally 1.4 cm on coronal image 17/2 versus 1.5 cm on 02/27/2022. T2 hyperintense with a thin septation within including on 17/2.   No hydronephrosis.   Stomach/Bowel: Normal stomach and abdominal bowel loops.   Vascular/Lymphatic: Normal caliber of the aorta and branch vessels. No retroperitoneal or retrocrural adenopathy.   Other:  No ascites.   Musculoskeletal: No acute osseous abnormality.   IMPRESSION: 1. Motion degraded exam, especially hindering the pre and postcontrast T1 weighted dynamic images. 2. Lower pole right renal lesion of interest is strongly favored to represent a minimally complex, Bosniak 2 cyst. This is similar in size back to 02/27/2022. Given limitations of the current exam, 1 follow-up with (preferred) pre and post contrast abdominal MRI or CT in 1 year is suggested. 3.  No acute abdominal process.   Electronically Signed   By: Jeronimo Greaves M.D.   On: 06/29/2023 12:17 Personally reviewed the above scan and agree with radiologic interpretation.    Assessment & Plan:    1. 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 Minimal growth. Asymptomatic. Study not completely diagnostic due to motion artifact.    - Plan for CT next year instead of MRI because movement won't be a factor. Depending on the results at that time will discuss options including continued vs. Discontinuation of surveillance  Return in about 1 year (around 08/04/2024) for CT  abdomen w/ and w/o contrast for renal cyst.  I have reviewed the above documentation for accuracy and completeness, and I agree with the above.   Vanna Scotland, MD    [facility] [address] [place], [place] [address] [phone]

## 2023-08-06 ENCOUNTER — Ambulatory Visit: Payer: Self-pay | Admitting: Urology

## 2023-08-11 NOTE — Telephone Encounter (Signed)
Called patient to offer appointment, patient did not take.  Patient is available Monday and Wednesday from  12 to 5pm

## 2023-10-17 NOTE — Telephone Encounter (Signed)
Patient has been scheduled

## 2023-10-20 ENCOUNTER — Ambulatory Visit: Payer: Self-pay | Admitting: Internal Medicine

## 2023-10-22 ENCOUNTER — Ambulatory Visit: Payer: Self-pay | Admitting: Internal Medicine

## 2023-10-22 ENCOUNTER — Encounter: Payer: Self-pay | Admitting: Internal Medicine

## 2023-10-22 VITALS — BP 122/80 | HR 79 | Resp 20 | Ht 65.0 in | Wt 158.0 lb

## 2023-10-22 DIAGNOSIS — N951 Menopausal and female climacteric states: Secondary | ICD-10-CM

## 2023-10-22 DIAGNOSIS — M7061 Trochanteric bursitis, right hip: Secondary | ICD-10-CM | POA: Insufficient documentation

## 2023-10-22 DIAGNOSIS — Z23 Encounter for immunization: Secondary | ICD-10-CM

## 2023-10-22 DIAGNOSIS — E010 Iodine-deficiency related diffuse (endemic) goiter: Secondary | ICD-10-CM

## 2023-10-22 DIAGNOSIS — N2889 Other specified disorders of kidney and ureter: Secondary | ICD-10-CM | POA: Insufficient documentation

## 2023-10-22 DIAGNOSIS — R102 Pelvic and perineal pain: Secondary | ICD-10-CM | POA: Insufficient documentation

## 2023-10-22 DIAGNOSIS — T148XXA Other injury of unspecified body region, initial encounter: Secondary | ICD-10-CM | POA: Insufficient documentation

## 2023-10-22 DIAGNOSIS — Z227 Latent tuberculosis: Secondary | ICD-10-CM | POA: Insufficient documentation

## 2023-10-22 DIAGNOSIS — G44209 Tension-type headache, unspecified, not intractable: Secondary | ICD-10-CM | POA: Insufficient documentation

## 2023-10-22 NOTE — Progress Notes (Signed)
    Subjective:    Patient ID: Kirsten Brown, female   DOB: 20-Jan-1976, 48 y.o.   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  Cannot say how long she was taking before stopping so they could test for an actual allergy.  Apparently, she is not felt to have an allergy to the medication and the plan is to restart, but she has not gone back in to pick up.  Sounds like Isoniazid as she was also taking pyridoxine.    2.  Bruising:  history of bruising on her inner thighs previously, but did not have at time of visit.  She states she had similar painful bruising on medial upper arms.  She cannot recall any injury or carrying something heavy that may have pulled on the skin. Occurred end of December.   She does not have photos this time. No bleeding from gums or nose.    3.  Excessive Sweating--axillary only:  Had TAH and BS in 2023 and she feels this started soon afterward.  She also gets hot flashes.  No insomnia.     Current Meds  Medication Sig   budesonide-formoterol (BREYNA) 80-4.5 MCG/ACT inhaler Inhale 2 puffs into the lungs 2 (two) times daily before a meal.   Allergies  Allergen Reactions   Asa [Aspirin] Shortness Of Breath     Review of Systems    Objective:   BP 122/80 (BP Location: Right Arm, Patient Position: Sitting)   Pulse 79   Resp 20   Ht 5\' 5"  (1.651 m)   Wt 158 lb (71.7 kg)   LMP 06/26/2022 (Exact Date)   SpO2 98%   BMI 26.29 kg/m   Physical Exam NAD HEENT: PERRL, EOMI, TMs pearly gray, throat without injection.  No bleeding or inflammation of gingiva. Neck:  Supple, No adenopathy.  Nodular thyromegaly. Chest:  CTA CV:  RRR without murmur or rub.  Radial and DP pulses normal and equal Abd:  S, NT, No HSM or mass, + BS LE:  No edema Skin:  no petechiae or bruising.      Assessment & Plan   Latent TB:  needs to get back to [facility] and pick up meds.  2.  Perimenopause:  discussed ERT pros and cons.  Elected  to not utilize.    3.  Bruising:  PT, PTT, CBC again, but do not feel her bruising is pathologic.    4.  Nodular goiter:  TSH, Free T4 and once she has orange card again, thyroid ultrasound.    5.  Right renal mass:  recent MR suggests no change in size.  Urology planning repeat, but CT due to motion degradation with MR in 1 year.

## 2023-10-23 LAB — CBC WITH DIFFERENTIAL/PLATELET
Basophils Absolute: 0.1 10*3/uL (ref 0.0–0.2)
Basos: 1 %
EOS (ABSOLUTE): 0.5 10*3/uL — ABNORMAL HIGH (ref 0.0–0.4)
Eos: 9 %
Hematocrit: 43.6 % (ref 34.0–46.6)
Hemoglobin: 14.9 g/dL (ref 11.1–15.9)
Immature Grans (Abs): 0 10*3/uL (ref 0.0–0.1)
Immature Granulocytes: 0 %
Lymphocytes Absolute: 2.4 10*3/uL (ref 0.7–3.1)
Lymphs: 44 %
MCH: 30 pg (ref 26.6–33.0)
MCHC: 34.2 g/dL (ref 31.5–35.7)
MCV: 88 fL (ref 79–97)
Monocytes Absolute: 0.4 10*3/uL (ref 0.1–0.9)
Monocytes: 7 %
NRBC: 1 % — ABNORMAL HIGH (ref 0–0)
Neutrophils Absolute: 2.1 10*3/uL (ref 1.4–7.0)
Neutrophils: 39 %
Platelets: 258 10*3/uL (ref 150–450)
RBC: 4.97 x10E6/uL (ref 3.77–5.28)
RDW: 12.5 % (ref 11.7–15.4)
WBC: 5.5 10*3/uL (ref 3.4–10.8)

## 2023-10-23 LAB — PROTIME-INR
INR: 1 (ref 0.9–1.2)
Prothrombin Time: 10.9 s (ref 9.1–12.0)

## 2023-10-23 LAB — APTT: aPTT: 28 s (ref 24–33)

## 2023-10-23 LAB — T4, FREE: Free T4: 1.01 ng/dL (ref 0.82–1.77)

## 2023-10-23 LAB — TSH: TSH: 1.42 u[IU]/mL (ref 0.450–4.500)

## 2023-11-09 ENCOUNTER — Encounter: Payer: Self-pay | Admitting: Internal Medicine

## 2023-11-17 ENCOUNTER — Other Ambulatory Visit: Payer: Self-pay

## 2023-11-17 DIAGNOSIS — T148XXA Other injury of unspecified body region, initial encounter: Secondary | ICD-10-CM

## 2023-11-18 LAB — LACTATE DEHYDROGENASE: LDH: 171 [IU]/L (ref 119–226)

## 2023-11-18 LAB — RETICULOCYTES: Retic Ct Pct: 1.2 % (ref 0.6–2.6)

## 2024-01-02 ENCOUNTER — Encounter: Payer: Self-pay | Admitting: Internal Medicine

## 2024-02-02 IMAGING — CT CT ABD-PELV W/ CM
2 of 5 series · 16 of 46 positions shown, 18 images · IV contrast (APPLIED)
Comparison: None.

CLINICAL DATA: Lower abdominal pain, weakness and fatigue.

EXAM:
CT ABDOMEN AND PELVIS WITH CONTRAST
TECHNIQUE: Multidetector CT imaging of the abdomen and pelvis was performed
using the standard protocol following bolus administration of
intravenous contrast.

[Series 3: abdomen 5.0 · axial · 0.67mm/px · z∈[+860,+1240]mm · 13 of 88 slices shown, 15 images]
[im 6/88  soft-tissue]
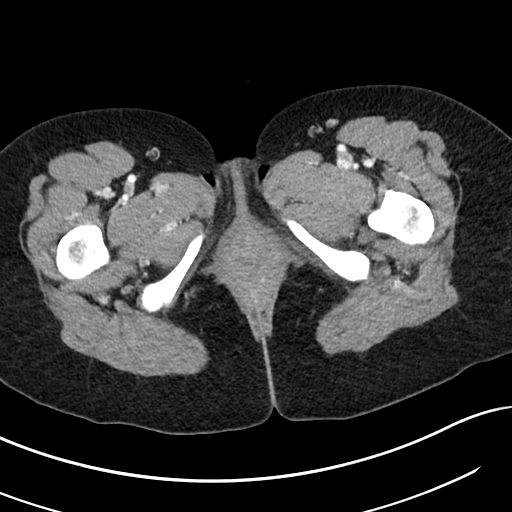
[im 6/88  bone]
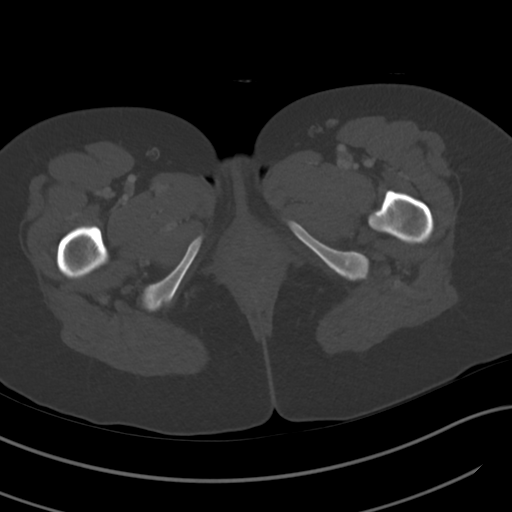
[im 12/88  soft-tissue]
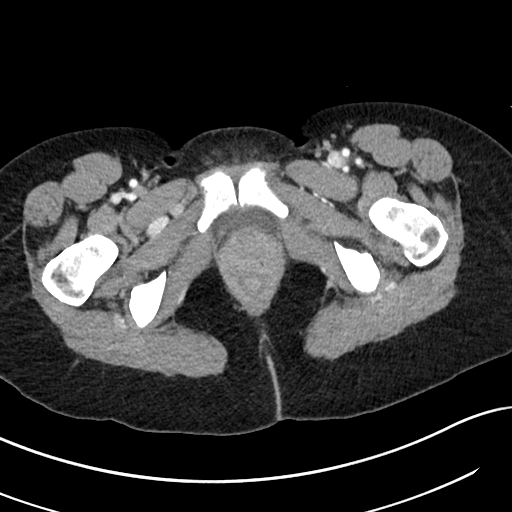
[im 18/88  soft-tissue]
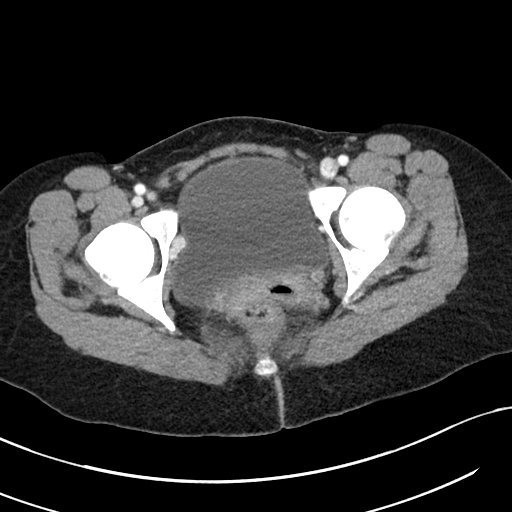
[im 24/88  soft-tissue]
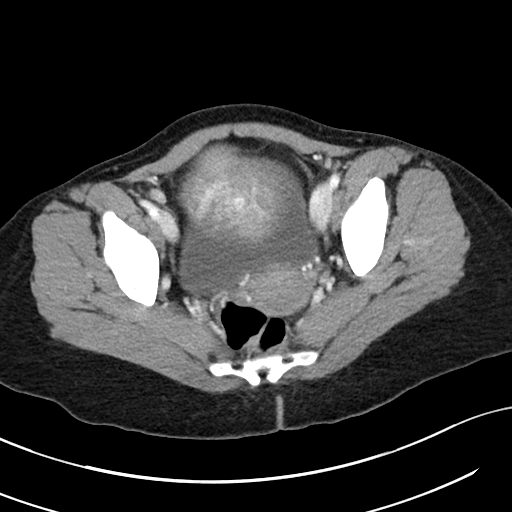
[im 30/88  soft-tissue]
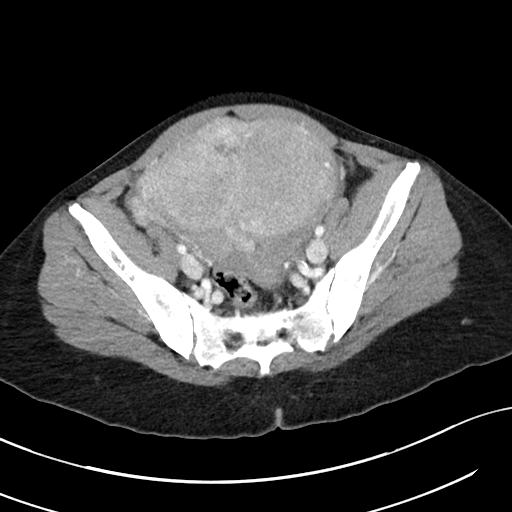
[im 35/88  soft-tissue]
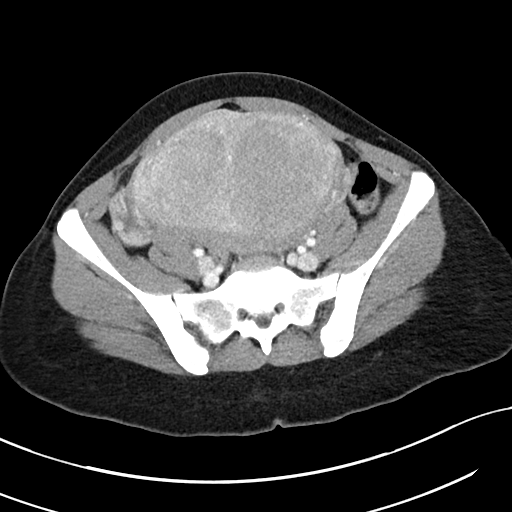
[im 47/88  soft-tissue]
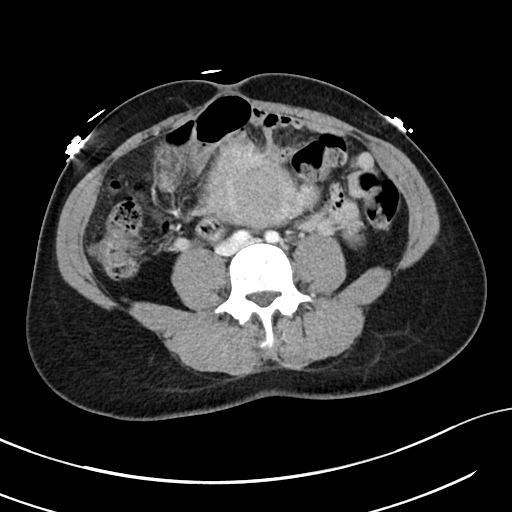
[im 53/88  soft-tissue]
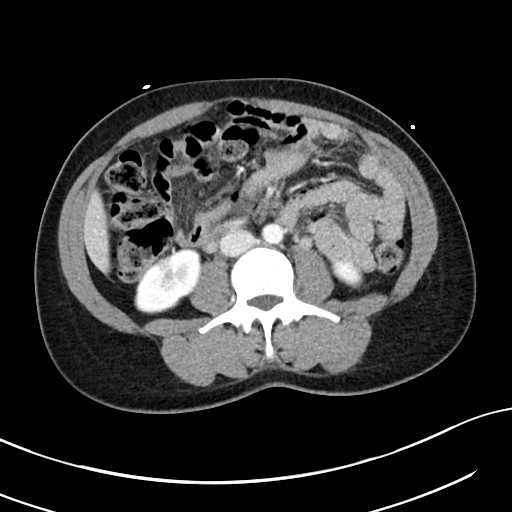
[im 59/88  soft-tissue]
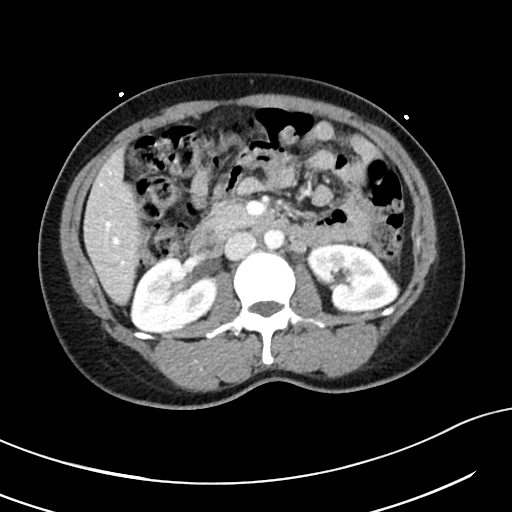
[im 59/88  bone]
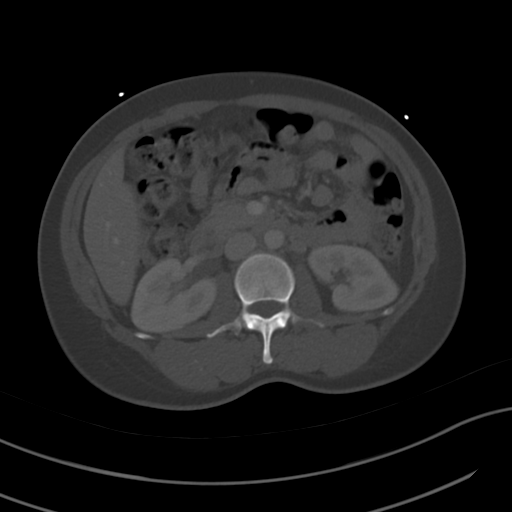
[im 64/88  soft-tissue]
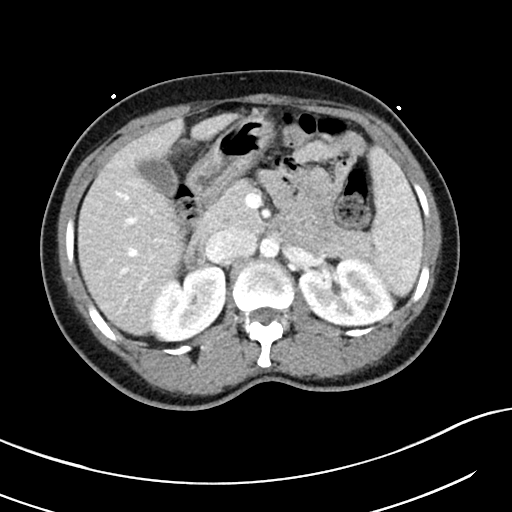
[im 70/88  soft-tissue]
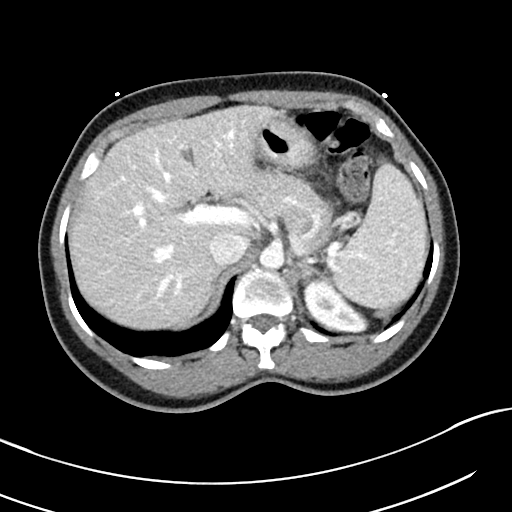
[im 76/88  soft-tissue]
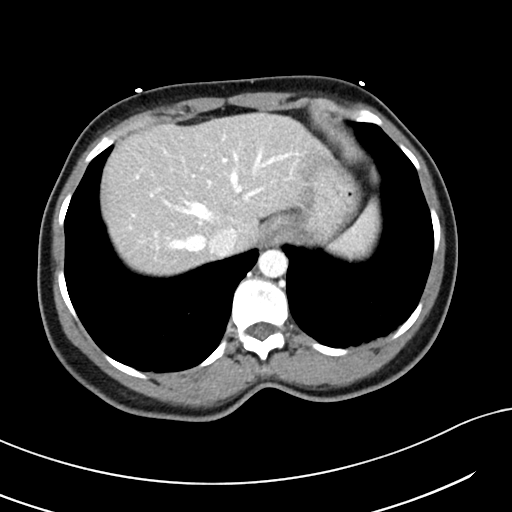
[im 82/88  soft-tissue]
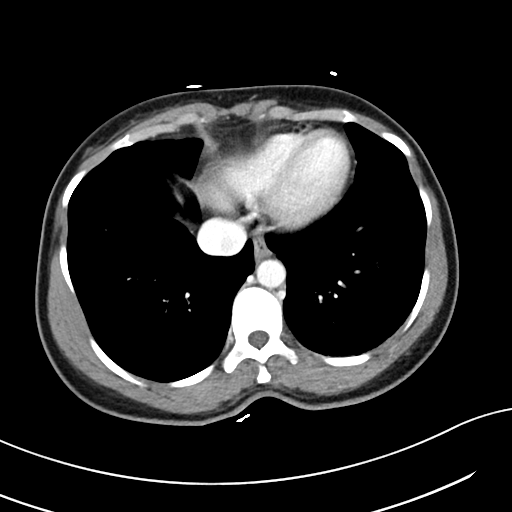

[Series 6: abdomen 3.0 mpr cor · coronal · 0.65mm/px · 3 of 85 slices shown]
[im 29/85  soft-tissue]
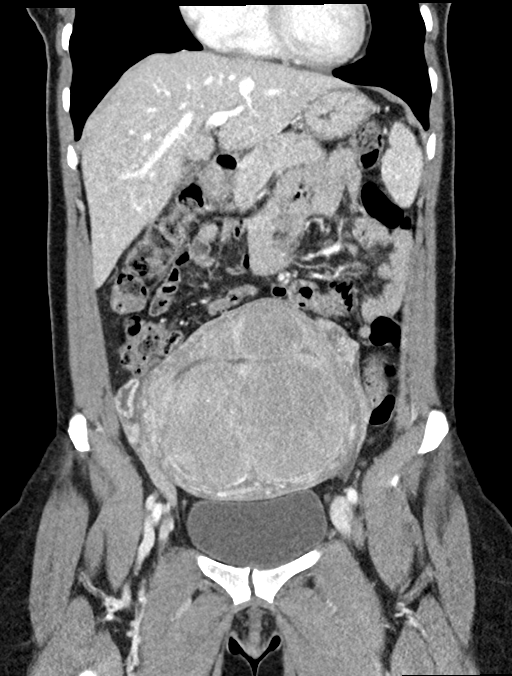
[im 38/85  soft-tissue]
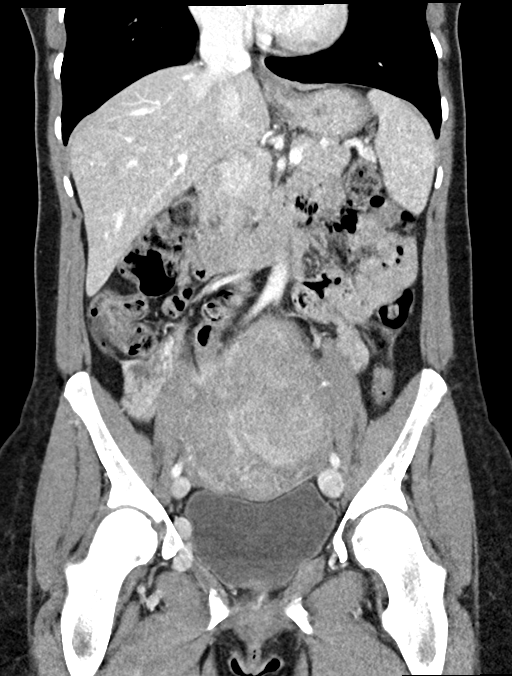
[im 47/85  soft-tissue]
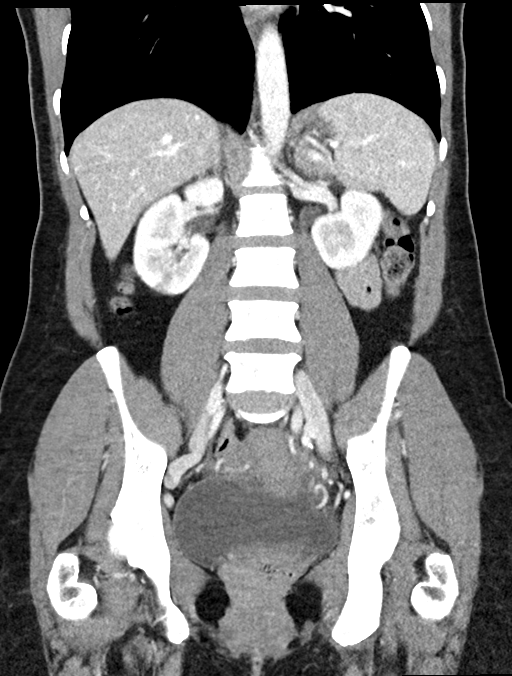

[16 of 46 positions shown; findings below may reference images not displayed]

RADIATION DOSE REDUCTION: This exam was performed according to the
departmental dose-optimization program which includes automated
exposure control, adjustment of the mA and/or kV according to
patient size and/or use of iterative reconstruction technique.

CONTRAST:  100mL OMNIPAQUE IOHEXOL 350 MG/ML SOLN
FINDINGS: Lower chest: Minimal peribronchovascular nodularity may be
postinfectious in etiology. Minimal dependent subpleural
atelectasis. Heart size normal. No pericardial or pleural effusion.

Hepatobiliary: Liver and gallbladder are unremarkable. No biliary
ductal dilatation.

Pancreas: Negative.

Spleen: Negative.

Adrenals/Urinary Tract: Adrenal glands are unremarkable. 1.4 cm
exophytic lesion off the lower pole right kidney measures 39
Hounsfield units. Kidneys are otherwise unremarkable. Ureters are
decompressed. Bladder is grossly unremarkable.

Stomach/Bowel: Stomach, small bowel and colon are unremarkable.
Appendix is not readily visualized.

Vascular/Lymphatic: Vascular structures are unremarkable. No
pathologically enlarged lymph nodes.

Reproductive: Uterus is enlarged and contains heterogeneous masses
measuring up to approximately 9.2 cm. No adnexal mass.

Other: Trace pelvic free fluid. Mesenteries and peritoneum are
otherwise unremarkable.

Musculoskeletal: None.
IMPRESSION: 1. No acute findings.
2. Enlarged fibroid uterus.
3. 1.4 cm exophytic lesion off the lower pole right kidney cannot be
characterized as a simple cyst. Further evaluation with nonemergent
outpatient pre and post contrast MRI should be considered. Pre and
post contrast CT could alternatively be performed, but would likely
be of decreased accuracy given lesion size.
4. Small pelvic free fluid.

## 2024-04-19 ENCOUNTER — Telehealth: Payer: Self-pay | Admitting: Internal Medicine

## 2024-04-19 NOTE — Telephone Encounter (Signed)
 Patient's CPE appointment was rescheduled for 09/30/23,  Patient would like a sooner appointment on a Monday or Wednesday afternoon.

## 2024-04-23 ENCOUNTER — Other Ambulatory Visit: Payer: Self-pay

## 2024-04-23 DIAGNOSIS — Z13220 Encounter for screening for lipoid disorders: Secondary | ICD-10-CM

## 2024-04-24 LAB — LIPID PANEL
Chol/HDL Ratio: 3.7 ratio (ref 0.0–4.4)
Cholesterol, Total: 163 mg/dL (ref 100–199)
HDL: 44 mg/dL (ref >39)
LDL Chol Calc (NIH): 109 mg/dL — ABNORMAL HIGH (ref 0–99)
Triglycerides: 46 mg/dL (ref 0–149)
VLDL Cholesterol Cal: 10 mg/dL (ref 5–40)

## 2024-04-28 ENCOUNTER — Encounter: Payer: Self-pay | Admitting: Internal Medicine

## 2024-04-30 NOTE — Telephone Encounter (Signed)
 Patient has been scheduled for 05/04/24 at 3:00PM.

## 2024-05-04 ENCOUNTER — Encounter: Payer: Self-pay | Admitting: Internal Medicine

## 2024-05-04 ENCOUNTER — Ambulatory Visit: Payer: Self-pay | Admitting: Internal Medicine

## 2024-05-04 VITALS — BP 130/86 | HR 64 | Resp 18 | Ht 65.0 in | Wt 163.0 lb

## 2024-05-04 DIAGNOSIS — R400 Somnolence: Secondary | ICD-10-CM

## 2024-05-04 DIAGNOSIS — Z1231 Encounter for screening mammogram for malignant neoplasm of breast: Secondary | ICD-10-CM

## 2024-05-04 DIAGNOSIS — G43809 Other migraine, not intractable, without status migrainosus: Secondary | ICD-10-CM

## 2024-05-04 DIAGNOSIS — G43909 Migraine, unspecified, not intractable, without status migrainosus: Secondary | ICD-10-CM | POA: Insufficient documentation

## 2024-05-04 DIAGNOSIS — H547 Unspecified visual loss: Secondary | ICD-10-CM

## 2024-05-04 DIAGNOSIS — R0600 Dyspnea, unspecified: Secondary | ICD-10-CM

## 2024-05-04 DIAGNOSIS — Z23 Encounter for immunization: Secondary | ICD-10-CM

## 2024-05-04 DIAGNOSIS — J4540 Moderate persistent asthma, uncomplicated: Secondary | ICD-10-CM

## 2024-05-04 DIAGNOSIS — Z0000 Encounter for general adult medical examination without abnormal findings: Secondary | ICD-10-CM

## 2024-05-04 DIAGNOSIS — N2889 Other specified disorders of kidney and ureter: Secondary | ICD-10-CM

## 2024-05-04 DIAGNOSIS — R0683 Snoring: Secondary | ICD-10-CM

## 2024-05-04 DIAGNOSIS — K029 Dental caries, unspecified: Secondary | ICD-10-CM

## 2024-05-04 MED ORDER — ALBUTEROL SULFATE HFA 108 (90 BASE) MCG/ACT IN AERS: 2.0000 | INHALATION_SPRAY | Freq: Four times a day (QID) | RESPIRATORY_TRACT | 2 refills | Status: AC | PRN

## 2024-05-04 MED ORDER — BUDESONIDE-FORMOTEROL FUMARATE 80-4.5 MCG/ACT IN AERO: 2.0000 | INHALATION_SPRAY | Freq: Two times a day (BID) | RESPIRATORY_TRACT | 11 refills | Status: AC

## 2024-05-04 MED ORDER — SUMATRIPTAN SUCCINATE 50 MG PO TABS: ORAL_TABLET | ORAL | 11 refills | Status: AC
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
## 2024-05-07 ENCOUNTER — Other Ambulatory Visit: Payer: Self-pay

## 2024-05-07 DIAGNOSIS — Z1211 Encounter for screening for malignant neoplasm of colon: Secondary | ICD-10-CM

## 2024-05-07 LAB — POC FIT TEST STOOL: Fecal Occult Blood: NEGATIVE

## 2024-05-10 ENCOUNTER — Ambulatory Visit: Payer: Self-pay | Admitting: Internal Medicine

## 2024-05-19 ENCOUNTER — Encounter: Payer: Self-pay | Admitting: Urology

## 2024-06-02 ENCOUNTER — Other Ambulatory Visit: Payer: Self-pay | Admitting: Internal Medicine

## 2024-06-02 ENCOUNTER — Ambulatory Visit
Admission: RE | Admit: 2024-06-02 | Discharge: 2024-06-02 | Disposition: A | Discharge status: Home / Self Care | Source: Ambulatory Visit | Attending: Internal Medicine | Admitting: Internal Medicine

## 2024-06-02 DIAGNOSIS — R0600 Dyspnea, unspecified: Secondary | ICD-10-CM

## 2024-06-02 DIAGNOSIS — N2889 Other specified disorders of kidney and ureter: Secondary | ICD-10-CM

## 2024-06-02 DIAGNOSIS — J4540 Moderate persistent asthma, uncomplicated: Secondary | ICD-10-CM

## 2024-06-02 DIAGNOSIS — R0683 Snoring: Secondary | ICD-10-CM

## 2024-06-02 DIAGNOSIS — Z0000 Encounter for general adult medical examination without abnormal findings: Secondary | ICD-10-CM

## 2024-06-02 DIAGNOSIS — G43809 Other migraine, not intractable, without status migrainosus: Secondary | ICD-10-CM

## 2024-06-02 DIAGNOSIS — Z1231 Encounter for screening mammogram for malignant neoplasm of breast: Secondary | ICD-10-CM

## 2024-06-02 DIAGNOSIS — Z23 Encounter for immunization: Secondary | ICD-10-CM

## 2024-06-02 DIAGNOSIS — K029 Dental caries, unspecified: Secondary | ICD-10-CM

## 2024-06-02 DIAGNOSIS — H547 Unspecified visual loss: Secondary | ICD-10-CM

## 2024-06-02 DIAGNOSIS — R400 Somnolence: Secondary | ICD-10-CM

## 2024-06-04 ENCOUNTER — Ambulatory Visit (HOSPITAL_BASED_OUTPATIENT_CLINIC_OR_DEPARTMENT_OTHER)
Admission: RE | Admit: 2024-06-04 | Discharge: 2024-06-04 | Disposition: A | Discharge status: Home / Self Care | Payer: Self-pay | Source: Ambulatory Visit | Attending: Urology | Admitting: Urology

## 2024-06-04 ENCOUNTER — Ambulatory Visit (HOSPITAL_BASED_OUTPATIENT_CLINIC_OR_DEPARTMENT_OTHER)
Admission: RE | Admit: 2024-06-04 | Discharge: 2024-06-04 | Disposition: A | Discharge status: Home / Self Care | Payer: Self-pay | Source: Ambulatory Visit | Attending: Internal Medicine | Admitting: Internal Medicine

## 2024-06-04 DIAGNOSIS — E010 Iodine-deficiency related diffuse (endemic) goiter: Secondary | ICD-10-CM | POA: Insufficient documentation

## 2024-06-04 DIAGNOSIS — N2889 Other specified disorders of kidney and ureter: Secondary | ICD-10-CM | POA: Insufficient documentation

## 2024-06-04 LAB — POCT I-STAT CREATININE: Creatinine, Ser: 0.5 mg/dL (ref 0.44–1.00)

## 2024-06-04 MED ORDER — IOHEXOL 350 MG/ML SOLN
75.0000 mL | Freq: Once | INTRAVENOUS | Status: AC | PRN
  Administered 2024-06-04: 75 mL via INTRAVENOUS

## 2024-06-12 ENCOUNTER — Other Ambulatory Visit: Payer: Self-pay

## 2024-06-12 ENCOUNTER — Emergency Department (HOSPITAL_BASED_OUTPATIENT_CLINIC_OR_DEPARTMENT_OTHER)
Admission: EM | Admit: 2024-06-12 | Discharge: 2024-06-12 | Disposition: A | Discharge status: Home / Self Care | Attending: Emergency Medicine | Admitting: Emergency Medicine

## 2024-06-12 ENCOUNTER — Encounter (HOSPITAL_BASED_OUTPATIENT_CLINIC_OR_DEPARTMENT_OTHER): Payer: Self-pay

## 2024-06-12 ENCOUNTER — Emergency Department (HOSPITAL_BASED_OUTPATIENT_CLINIC_OR_DEPARTMENT_OTHER)

## 2024-06-12 DIAGNOSIS — H10502 Unspecified blepharoconjunctivitis, left eye: Secondary | ICD-10-CM | POA: Insufficient documentation

## 2024-06-12 LAB — COMPREHENSIVE METABOLIC PANEL WITH GFR
ALT: 13 U/L (ref 0–44)
AST: 27 U/L (ref 15–41)
Albumin: 4.4 g/dL (ref 3.5–5.0)
Alkaline Phosphatase: 58 U/L (ref 38–126)
Anion gap: 12 (ref 5–15)
BUN: 8 mg/dL (ref 6–20)
CO2: 24 mmol/L (ref 22–32)
Calcium: 9.7 mg/dL (ref 8.9–10.3)
Chloride: 104 mmol/L (ref 98–111)
Creatinine, Ser: 0.74 mg/dL (ref 0.44–1.00)
GFR, Estimated: >60 mL/min (ref >60)
Glucose, Bld: 89 mg/dL (ref 70–99)
Potassium: 3.8 mmol/L (ref 3.5–5.1)
Sodium: 140 mmol/L (ref 135–145)
Total Bilirubin: 0.5 mg/dL (ref 0.0–1.2)
Total Protein: 7.5 g/dL (ref 6.5–8.1)

## 2024-06-12 LAB — CBC WITH DIFFERENTIAL/PLATELET
Abs Immature Granulocytes: 0.01 K/uL (ref 0.00–0.07)
Basophils Absolute: 0 K/uL (ref 0.0–0.1)
Basophils Relative: 1 %
Eosinophils Absolute: 0.9 K/uL — ABNORMAL HIGH (ref 0.0–0.5)
Eosinophils Relative: 13 %
HCT: 42.5 % (ref 36.0–46.0)
Hemoglobin: 14.5 g/dL (ref 12.0–15.0)
Immature Granulocytes: 0 %
Lymphocytes Relative: 33 %
Lymphs Abs: 2.2 K/uL (ref 0.7–4.0)
MCH: 29.4 pg (ref 26.0–34.0)
MCHC: 34.1 g/dL (ref 30.0–36.0)
MCV: 86.2 fL (ref 80.0–100.0)
Monocytes Absolute: 0.6 K/uL (ref 0.1–1.0)
Monocytes Relative: 10 %
Neutro Abs: 2.9 K/uL (ref 1.7–7.7)
Neutrophils Relative %: 43 %
Platelets: 244 K/uL (ref 150–400)
RBC: 4.93 MIL/uL (ref 3.87–5.11)
RDW: 12.5 % (ref 11.5–15.5)
WBC: 6.7 K/uL (ref 4.0–10.5)
nRBC: 0 % (ref 0.0–0.2)

## 2024-06-12 LAB — HCG, SERUM, QUALITATIVE: Preg, Serum: NEGATIVE

## 2024-06-12 MED ORDER — ERYTHROMYCIN 5 MG/GM OP OINT: TOPICAL_OINTMENT | OPHTHALMIC | 0 refills | Status: AC

## 2024-06-12 MED ORDER — IOHEXOL 300 MG/ML  SOLN
100.0000 mL | Freq: Once | INTRAMUSCULAR | Status: AC | PRN
  Administered 2024-06-12: 75 mL via INTRAVENOUS

## 2024-06-12 MED ORDER — POLYMYXIN B-TRIMETHOPRIM 10000-0.1 UNIT/ML-% OP SOLN: 2.0000 [drp] | OPHTHALMIC | 0 refills | Status: AC

## 2024-06-12 MED ORDER — FLUORESCEIN SODIUM 1 MG OP STRP
1.0000 | ORAL_STRIP | Freq: Once | OPHTHALMIC | Status: AC
  Administered 2024-06-12: 1 via OPHTHALMIC
  Filled 2024-06-12: qty 1

## 2024-06-12 MED ORDER — FENTANYL CITRATE PF 50 MCG/ML IJ SOSY
25.0000 ug | PREFILLED_SYRINGE | Freq: Once | INTRAMUSCULAR | Status: AC
  Administered 2024-06-12: 25 ug via INTRAVENOUS
  Filled 2024-06-12: qty 1

## 2024-06-12 MED ORDER — TETRACAINE HCL 0.5 % OP SOLN
2.0000 [drp] | Freq: Once | OPHTHALMIC | Status: AC
  Administered 2024-06-12: 2 [drp] via OPHTHALMIC
  Filled 2024-06-12: qty 4

## 2024-06-12 NOTE — ED Triage Notes (Addendum)
 Had redness in the left eye on 9/8 and was given tobramycin to use. Last drops was this Thursday now PT is complaining of left eye hurting and red. Started yesterday. It is very sensitive to light.

## 2024-06-12 NOTE — ED Provider Notes (Signed)
 [place] [facility]GENCY DEPARTMENT AT [facility] Provider Note   CSN: [id number] Arrival date & time: 06/12/24  1759     Patient presents with: Eye Pain   Kirsten Brown is a 48 y.o. female.  Patient with past history significant for iron deficiency anemia, latent TB, tension headaches presents the emergency department with concerns of left eye pain.  She reports that she was initially seen on 9/8 for concerns of eye irritation and was diagnosed with conjunctivitis and started on tobramycin.  States that she last finished this on Thursday, 2 days ago, and has now developed left eye pain and redness.  No reports of any foreign objects or body falling into the eye.  Denies any injury or scratches.  Denies fever, chills, body aches.   Eye Pain       Prior to Admission medications   Medication Sig Start Date End Date Taking? Authorizing Provider  erythromycin  ophthalmic ointment Place a 1/2 inch ribbon of ointment onto the upper eyelid. 06/12/24  Yes Hunter Bachar A, PA-C  trimethoprim -polymyxin b  (POLYTRIM ) ophthalmic solution Place 2 drops into the left eye every 4 (four) hours. 06/12/24  Yes Trig Mcbryar A, PA-C  albuterol  (VENTOLIN  HFA) 108 (90 Base) MCG/ACT inhaler Inhale 2 puffs into the lungs every 6 (six) hours as needed for wheezing or shortness of breath. 05/04/24   Adella Norris, MD  budesonide -formoterol  (BREYNA ) 80-4.5 MCG/ACT inhaler Inhale 2 puffs into the lungs 2 (two) times daily before a meal. 05/04/24   Adella Norris, MD  SUMAtriptan  (IMITREX ) 50 MG tablet 1 tab by mouth as needed for headache and may repeat in 2 hours if not relieved  Max dose of 200 mg in 24 hours. 05/04/24   Adella Norris, MD    Allergies: Asa [aspirin]    Review of Systems  Eyes:  Positive for pain.  All other systems reviewed and are negative.   Updated Vital Signs BP (!) 137/91   Pulse 71   Temp 98.1 F (36.7 C) (Oral)   Resp 18   Ht 5' 5 (1.651 m)   Wt  70.3 kg   LMP 06/26/2022 (Exact Date)   SpO2 100%   BMI 25.79 kg/m   Physical Exam Vitals and nursing note reviewed.  Constitutional:      General: She is not in acute distress.    Appearance: She is well-developed.  HENT:     Head: Normocephalic and atraumatic.  Eyes:     General:        Right eye: No discharge.        Left eye: Discharge present.    Extraocular Movements: Extraocular movements intact.     Conjunctiva/sclera: Conjunctivae normal.     Pupils: Pupils are equal, round, and reactive to light.     Comments: Watery discharge from the left eye. Upper eyelid is erythematous and swollen with tenderness to touch. Fluorescein  stain does not reveal any obvious signs of corneal abrasion or keratitis.  Cardiovascular:     Rate and Rhythm: Normal rate and regular rhythm.     Heart sounds: No murmur heard. Pulmonary:     Effort: Pulmonary effort is normal. No respiratory distress.     Breath sounds: Normal breath sounds.  Abdominal:     Palpations: Abdomen is soft.     Tenderness: There is no abdominal tenderness.  Musculoskeletal:        General: No swelling.     Cervical back: Neck supple.  Skin:  General: Skin is warm and dry.     Capillary Refill: Capillary refill takes less than 2 seconds.  Neurological:     Mental Status: She is alert.  Psychiatric:        Mood and Affect: Mood normal.     (all labs ordered are listed, but only abnormal results are displayed) Labs Reviewed  CBC WITH DIFFERENTIAL/PLATELET - Abnormal; Notable for the following components:      Result Value   Eosinophils Absolute 0.9 (*)    All other components within normal limits  COMPREHENSIVE METABOLIC PANEL WITH GFR  HCG, SERUM, QUALITATIVE    EKG: None  Radiology: CT Orbits W Contrast Result Date: 06/12/2024 EXAM: CT ORBITS WITH CONTRAST 06/12/2024 07:42:58 PM TECHNIQUE: CT of the orbits was performed with the administration of intravenous contrast. Multiplanar reformatted images  are provided for review. Automated exposure control, iterative reconstruction, and/or weight based adjustment of the mA/kV was utilized to reduce the radiation dose to as low as reasonably achievable. COMPARISON: None available. CLINICAL HISTORY: Periorbital cellulitis. Had redness in the left eye on 9/8 and was given tobramycin to use. Last drops was this Thursday now PT is complaining of left eye hurting and red. Started yesterday. It is very sensitive to light. FINDINGS: ORBITS: Globes are intact. Normal extraocular muscles. Normal optic nerve-sheath complexes. No hematoma or inflammatory change. No mass. SOFT TISSUES: No acute abnormality. SINUSES AND MASTOIDS: Mild paranasal sinus mucosal thickening. BONES: No acute abnormality. IMPRESSION: 1. No acute abnormality of the orbits. 2. Mild paranasal sinus mucosal thickening. Electronically signed by: Franky Stanford MD 06/12/2024 08:09 PM EDT RP Workstation: [id number]     Procedures   Medications Ordered in the ED  tetracaine  (PONTOCAINE) 0.5 % ophthalmic solution 2 drop (2 drops Left Eye Given by Other 06/12/24 1834)  fluorescein  ophthalmic strip 1 strip (1 strip Left Eye Given by Other 06/12/24 1834)  fentaNYL  (SUBLIMAZE ) injection 25 mcg (25 mcg Intravenous Given 06/12/24 1858)  iohexol  (OMNIPAQUE ) 300 MG/ML solution 100 mL (75 mLs Intravenous Contrast Given 06/12/24 1937)                                    Medical Decision Making Amount and/or Complexity of Data Reviewed Labs: ordered. Radiology: ordered.  Risk Prescription drug management.   This patient presents to the ED for concern of eye pain. Differential diagnosis includes conjunctivitis, keratitis, periorbital cellulitis, corneal abrasion   Lab Tests:  I Ordered, and personally interpreted labs.  The pertinent results include: CBC unremarkable with no leukocytosis, CMP negative for any acute findings, hCG negative   Imaging Studies ordered:  I ordered imaging studies  including CT orbits with contrast I independently visualized and interpreted imaging which showed no obvious abnormalities I agree with the radiologist interpretation   Medicines ordered and prescription drug management:  I ordered medication including fluorescein , tetracaine , fentanyl  for eye examination, pain Reevaluation of the patient after these medicines showed that the patient improved I have reviewed the patients home medicines and have made adjustments as needed   Problem List / ED Course:  Patient with past history significant for anemia presents to the emergency department with concerns of eye pain.  States that she was initially treated for a corneal abrasion or conjunctivitis about 2 weeks confessed antibiotics 2 days ago.  Has not had significant worsening of pain in the left eye.  States that she is unable to fully open  the eye due to pain.  Endorses pain to the upper eyelid.  No fever, chills or bodyaches. Exam reveals no obvious signs of any corneal injury on fluorescein  staining.  There is erythema and swelling to the upper eyelid with significant tenderness when attempting to manipulate the eyelid for eye examination.  No obvious proptosis.  With no obvious signs of abrasion, suspect possible periorbital cellulitis.  Will workup for this concern. Lab workup and CT imaging negative for any acute findings.  Doubtful of periorbital cellulitis.  He started blepharoconjunctivitis of the left eye.  Will start patient on combination of Polytrim  and erythromycin  ointment.  No signs of corneal lesion or abrasion but did encourage patient to follow-up closely with ophthalmology given her inability to fully have symptoms resolved at home.  Patient is agreeable to current plan and verbalized understanding all return precautions.  Discharged home in stable condition.   Social Determinants of Health:  None  Final diagnoses:  Blepharoconjunctivitis of left eye, unspecified  blepharoconjunctivitis type    ED Discharge Orders          Ordered    erythromycin  ophthalmic ointment        06/12/24 2039    trimethoprim -polymyxin b  (POLYTRIM ) ophthalmic solution  Every 4 hours        06/12/24 2039               Nikko Goldwire A, PA-C 06/12/24 2341    Zackowski, Scott, MD 06/13/24 1651

## 2024-06-12 NOTE — Discharge Instructions (Signed)
 You were seen in the ER today for concerns of left eye pain. You appear to have a condition called blepharitis which is due to swelling of the upper eyelid caused by a bacteria.  I am starting you on an ointment called erythromycin  that you should apply to the upper eyelid.  I am also starting on a new antibiotic drops for the left eye called Polytrim .  Please use this as prescribed for the next 7 days.  Follow-up with an ophthalmologist or optometrist for further evaluation.  Return to the emergency department for any concerns or new or worsening symptoms.

## 2024-06-14 ENCOUNTER — Ambulatory Visit: Payer: Self-pay | Admitting: Internal Medicine

## 2024-06-14 DIAGNOSIS — E041 Nontoxic single thyroid nodule: Secondary | ICD-10-CM

## 2024-06-14 DIAGNOSIS — N289 Disorder of kidney and ureter, unspecified: Secondary | ICD-10-CM

## 2024-07-12 ENCOUNTER — Ambulatory Visit (HOSPITAL_BASED_OUTPATIENT_CLINIC_OR_DEPARTMENT_OTHER): Payer: Self-pay | Attending: Internal Medicine | Admitting: Internal Medicine

## 2024-07-12 DIAGNOSIS — R0683 Snoring: Secondary | ICD-10-CM

## 2024-07-12 DIAGNOSIS — G4733 Obstructive sleep apnea (adult) (pediatric): Secondary | ICD-10-CM | POA: Insufficient documentation

## 2024-07-12 DIAGNOSIS — R400 Somnolence: Secondary | ICD-10-CM

## 2024-07-21 ENCOUNTER — Telehealth: Payer: Self-pay | Admitting: Internal Medicine

## 2024-07-21 NOTE — Telephone Encounter (Signed)
 Patient had to cancel her upcoming appointment , patient would like a sooner appointment for a Monday or Wednesday afternoon.

## 2024-07-22 ENCOUNTER — Ambulatory Visit: Payer: Self-pay | Admitting: Internal Medicine

## 2024-07-23 NOTE — Telephone Encounter (Signed)
 Called patient to offer appointment, patient did not answer,

## 2024-07-24 NOTE — Procedures (Signed)
 [facility] [address] [place], [place] [address] Tel: [phone]   Fax: [phone]  Polysomnography Interpretation  Patient Name:  Kirsten Brown, Kirsten Brown Date:  07/12/2024 Referring Physician:  ALMARIE BOLDS ([id number]) %%startinterp%% Indications for Polysomnography The patient is a 48 year old Female who is 5' 5 and weighs 165.0 lbs. Her BMI equals 27.5.  A full night polysomnogram was performed to evaluate for -OSA  No medication was taken.  No Data.   Polysomnogram Data A full night polysomnogram recorded the standard physiologic parameters including EEG, EOG, EMG, EKG, nasal and oral airflow.  Respiratory parameters of chest and abdominal movements were recorded with Respiratory Inductance Plethysmography belts.  Oxygen saturation was recorded by pulse oximetry.   Sleep Architecture The total recording time of the polysomnogram was 382.1 minutes.  The total sleep time was 341.0 minutes.  The patient spent 1.8% of total sleep time in Stage N1, 38.0% in Stage N2, 22.1% in Stages N3, and 38.1% in REM.  Sleep latency was 6.0 minutes.  REM latency was 162.5 minutes.  Sleep Efficiency was 89.3%.  Wake after Sleep Onset time was 35.0 minutes.  Respiratory Events The polysomnogram revealed a presence of 46 obstructive, - central, and - mixed apneas resulting in an Apnea index of 8.1 events per hour.  There were 17 hypopneas (>=3% desaturation and/or arousal) resulting in an Apnea\Hypopnea Index (AHI >=3% desaturation and/or arousal) of 11.1 events per hour.  There were 5 hypopneas (>=4% desaturation) resulting in an Apnea\Hypopnea Index (AHI >=4% desaturation) of 9.0 events per hour.  There were 5 Respiratory Effort Related Arousals resulting in a RERA index of 0.9 events per hour. The Respiratory Disturbance Index is 12.0 events per hour.  The snore index was - events per hour.  Mean oxygen saturation was 96.1%.  The lowest oxygen saturation  during sleep was 87.0%.  Time spent <=88% oxygen saturation was 0.2 minutes (0.1%).  Limb Activity There were - total limb movements recorded, of this total, - were classified as PLMs.  PLM index was - per hour and PLM associated with Arousals index was - per hour.  Cardiac Summary The average pulse rate was 72.3 bpm.  The minimum pulse rate was 59.0 bpm while the maximum pulse rate was 108.0 bpm.  Cardiac rhythm was normal.  Comments: Mild obstructive sleep apnea, AHI (3%) 11.1/hr. Moderate to very loud snoring with oxygen desaturation to a nadir of 87%, mean 96.1%. Insufficient early events to meet protocol requirement for split CPAP titration.  Diagnosis: Obstructive sleep apnea  Recommendations: Suggest autopap 5-15, CPAP titration sleep study or fitted oral appliance.   This study was personally reviewed and electronically signed by: Neysa Rama, MD Accredited Board Certified in Sleep Medicine Date/Time: 07/24/24  2:26    %%endinterp%%   Diagnostic PSG Report  Patient Name: Brown, Kirsten Study Date: 07/12/2024  Date of Birth: Mar 22, 1976 Study Type: Diagnostic  Age: 37 year MRN #: [id number]  Sex: Female Interpreting Physician: NEYSA RAMA, [id number]  Height: 5' 5 Referring Physician: ALMARIE BOLDS ([id number])  Weight: 165.0 lbs Recording Tech: Hargis Abu RPSGT RST  BMI: 27.5 Scoring Tech: Hargis Abu RPSGT RST  ESS: 9 Neck Size: 13.5   Study Overview  Lights Off: 10:07:32 PM  Count Index  Lights On: 04:29:35 AM Awakenings: 8 1.4  Time in Bed: 382.1 min. Arousals: 61 10.7  Total Sleep Time: 341.0 min. AHI (>=3% Desat and/or Ar.): 63 11.1   Sleep Efficiency: 89.3% AHI (>=4% Desat): 51 9.0  Sleep Latency: 6.0 min. Limb Movements: - -  Wake After Sleep Onset: 35.0 min. Snore: - -  REM Latency from Sleep Onset: 162.5 min. Desaturations: 30 5.3     Minimum SpO2 TST: 87.0%    Sleep Architecture  % of Time in Bed Stages Time (mins) % Sleep Time  Wake 41.5   Stage N1  6.0 1.8%  Stage N2 129.5 38.0%  Stage N3 75.5 22.1%  REM 130.0 38.1%   Arousal Summary   NREM REM Sleep Index  Respiratory Arousals 8 7 15  2.6  PLM Arousals - - - -  Isolated Limb Movement Arousals - - - -  Snore Arousals - - - -  Spontaneous Arousals 33 13 46 8.1  Total 41 20 61 10.7   Limb Movement Summary   Count Index  Isolated Limb Movements - -  Periodic Limb Movements (PLMs) - -  Total Limb Movements - -    Respiratory Summary   By Sleep Stage By Body Position Total   NREM REM Supine Non-Supine   Time (min) 211.0 130.0 80.0 261.0 341.0         Obstructive Apnea 19 27 13  33 46  Mixed Apnea - - - - -  Central Apnea - - - - -  Total Apneas 19 27 13  33 46  Total Apnea Index 5.4 12.5 9.8 7.6 8.1         Hypopneas (>=3% Desat and/or Ar.) 4 13 11 6 17   AHI (>=3% Desat and/or Ar.) 6.5 18.5 18.0 9.0 11.1         Hypopneas (>=4% Desat) - 5 4 1 5   AHI (>=4% Desat) 5.4 14.8 12.8 7.8 9.0          RERAs 2 3 1 4 5   RERA Index 0.6 1.4 0.8 0.9 0.9         RDI 7.1 19.8 18.8 9.9 12.0    Respiratory Event Type Index  Central Apneas -  Obstructive Apneas 8.1  Mixed Apneas -  Central Hypopneas -  Obstructive Hypopneas -  Central Apnea + Hypopnea (CAHI) -  Obstructive Apnea + Hypopnea (OAHI) 11.8   Respiratory Event Durations   Apnea Hypopnea   NREM REM NREM REM  Average (seconds) 13.0 14.2 20.2 24.4  Maximum (seconds) 23.8 31.3 24.4 62.1    Oxygen Saturation Summary   Wake NREM REM TST TIB  Average SpO2 (%) 96.9% 96.0% 95.9% 96.0% 96.1%  Minimum SpO2 (%) 79.0% 91.0% 87.0% 87.0% 79.0%  Maximum SpO2 (%) 100.0% 99.0% 98.0% 99.0% 100.0%   Oxygen Saturation Distribution  Range (%) Time in range (min) Time in range (%)  90.0 - 100.0 377.6 99.9%  80.0 - 90.0 0.6 0.1%  70.0 - 80.0 0.0 0.0%  60.0 - 70.0 - -  50.0 - 60.0 - -  0.0 - 50.0 - -  Time Spent <=88% SpO2  Range (%) Time in range (min) Time in range (%)  0.0 - 88.0 0.2 0.1%      Count Index   Desaturations 30 5.3    Cardiac Summary   Wake NREM REM Sleep Total  Average Pulse Rate (BPM) 75.5 72.3 71.3 71.9 72.3  Minimum Pulse Rate (BPM) 62.0 59.0 59.0 59.0 59.0  Maximum Pulse Rate (BPM) 108.0 92.0 89.0 92.0 108.0   Pulse Rate Distribution:  Range (bpm) Time in range (min) Time in range (%)  0.0 - 40.0 - -  40.0 - 60.0 1.4 0.4%  60.0 - 80.0 349.2 92.2%  80.0 - 100.0 27.3 7.2%  100.0 - 120.0 0.6 0.2%  120.0 - 140.0 - -  140.0 - 200.0 - -      Hypnograms                      Technologist Comments  Patient was at Sleep Lab for Snoring. A Split Night Study was ordered.  Patient was fitted with a Small AirFit F20 for her FFM. Patient did not meet Split Night protocol due to not enough respiratory events. Respiratory events were noted. Snoring was moderate to very loud. Periodic Limb Movement was rare.  EKG showed NSR. No medication was taken. No oxygen was applied.                            Reggy Salt Diplomate, Biomedical Engineer of Sleep Medicine  ELECTRONICALLY SIGNED ON:  07/24/2024, 2:19 PM [facility] PH: [phone]   FX: [phone] ACCREDITED BY THE [employer]ICAN ACADEMY OF SLEEP MEDICINE

## 2024-08-09 ENCOUNTER — Ambulatory Visit: Admitting: Urology

## 2024-08-09 NOTE — Progress Notes (Signed)
   08/13/2024 11:28 AM   Kirsten Brown 07-21-1976 [id number]  Reason for visit: Follow up renal cyst   HPI: 48 y.o. female, initial follow up with me today, previously seen by Dr. Penne  Prior HPI: Hx of a Bosniak IIF Right renal cyst  - initially dx Jan 2024, 1.5cm RLP cyst  - MRI Oct 2024 - 1.5cm Bosniak IIF, thin hyperintense septations    Physical Exam: BP (!) 151/88   Pulse 87   LMP 06/26/2022 (Exact Date)    Constitutional:  Alert and oriented, No acute distress.  Laboratory Data:  Latest Reference Range & Units 06/12/24 18:44  Creatinine 0.44 - 1.00 mg/dL 9.25    Pertinent Imaging: I have personally viewed and interpreted the CT Abd (06/10/24) -decreased size of partially exophytic right lower pole cyst, now measuring 10-11 mm from 15 mm.  Weak enhancement, median HU 40 (from 20 pre, although small ROI with this size lesion).  Remainder of bilateral kidneys morphologically normal, no other cyst or lesions.  No hydronephrosis.  IMPRESSION: 1. Partially exophytic lesion of the anterior inferior pole right kidney measuring 1.1 x 1.0 cm. This appears to demonstrate low-level contrast enhancement. This is unchanged in size compared to examinations dating back to 02/27/2022. Particularly given patient age and stability, this may reflect an involuted cyst remnant however an indolent papillary renal cell carcinoma is not strictly excluded. Consider ongoing surveillance. 2. No evidence of lymphadenopathy or metastatic disease in the abdomen or pelvis.      Assessment & Plan:    Right kidney mass Assessment & Plan: Hx of a Bosniak IIF Right renal cyst  - CT Jan 2024, 1.5cm RLP cyst  - MRI Oct 2024 - 1.5cm Bosniak IIF, thin hyperintense septations  - CT Nov 2025 - 1.0 cm weakly enhancing lesion, Bosniak IIF/III  Reviewed her clinical history and recent imaging.  The right lower pole renal lesion remains indeterminate.  Reassuringly, it has decreased in size by  about 5 mm since MRI last year.  Perhaps due to involuted nature, lesion appears slightly more solid with low-level enhancement-remains unclear if underlying malignancy.  However, the lesion is very stable and is not increasing in size.  I do think it is reasonable to continue close periodic surveillance.  - CT renal mass protocol in 1 year. If stable, consider downgrading to RBUS- may consider discontinuation in the next 1-2 years if stable        Penne JONELLE Skye, MD  [facility] [address] [place], [place] [address] [phone]

## 2024-08-09 NOTE — Assessment & Plan Note (Addendum)
 Hx of a Bosniak IIF Right renal cyst  - CT Jan 2024, 1.5cm RLP cyst  - MRI Oct 2024 - 1.5cm Bosniak IIF, thin hyperintense septations  - CT Nov 2025 - 1.0 cm weakly enhancing lesion, Bosniak IIF/III  Reviewed her clinical history and recent imaging.  The right lower pole renal lesion remains indeterminate.  Reassuringly, it has decreased in size by about 5 mm since MRI last year.  Perhaps due to involuted nature, lesion appears slightly more solid with low-level enhancement-remains unclear if underlying malignancy.  However, the lesion is very stable and is not increasing in size.  I do think it is reasonable to continue close periodic surveillance.  - CT renal mass protocol in 1 year. If stable, consider downgrading to RBUS- may consider discontinuation in the next 1-2 years if stable

## 2024-08-10 ENCOUNTER — Ambulatory Visit: Payer: Self-pay | Admitting: Urology

## 2024-08-11 NOTE — Telephone Encounter (Signed)
 Attempted to call patient, no answer and unable to lvm as voicemail is not set up.

## 2024-08-13 ENCOUNTER — Ambulatory Visit (INDEPENDENT_AMBULATORY_CARE_PROVIDER_SITE_OTHER): Payer: Self-pay | Admitting: Urology

## 2024-08-13 VITALS — BP 151/88 | HR 87

## 2024-08-13 DIAGNOSIS — N2889 Other specified disorders of kidney and ureter: Secondary | ICD-10-CM

## 2024-08-25 NOTE — Telephone Encounter (Signed)
 Attempted to call patient to offer appointment, patient did not  answer and unable to lvm as voicemail is not set up.

## 2024-08-25 NOTE — Telephone Encounter (Signed)
 Attempted to call patient, no answer and unable to lvm as voicemail is not set up.

## 2024-09-29 ENCOUNTER — Encounter: Payer: Self-pay | Admitting: Internal Medicine

## 2024-10-25 ENCOUNTER — Ambulatory Visit: Payer: Self-pay | Admitting: Internal Medicine

## 2025-08-12 ENCOUNTER — Ambulatory Visit: Admitting: Urology
